# Patient Record
Sex: Female | Born: 1990 | Race: White | Hispanic: No | Marital: Single
Health system: Midwestern US, Community
[De-identification: ages and names within clinical notes are randomized; demographics above are authoritative.]

## PROBLEM LIST (undated history)

## (undated) DIAGNOSIS — Z3482 Encounter for supervision of other normal pregnancy, second trimester: Secondary | ICD-10-CM

## (undated) DIAGNOSIS — O99322 Drug use complicating pregnancy, second trimester: Secondary | ICD-10-CM

## (undated) DIAGNOSIS — Z3201 Encounter for pregnancy test, result positive: Secondary | ICD-10-CM

## (undated) DIAGNOSIS — O99320 Drug use complicating pregnancy, unspecified trimester: Secondary | ICD-10-CM

## (undated) DIAGNOSIS — F1120 Opioid dependence, uncomplicated: Secondary | ICD-10-CM

## (undated) DIAGNOSIS — E282 Polycystic ovarian syndrome: Secondary | ICD-10-CM

## (undated) DIAGNOSIS — M1990 Unspecified osteoarthritis, unspecified site: Secondary | ICD-10-CM

## (undated) HISTORY — DX: Polycystic ovarian syndrome: E28.2

## (undated) HISTORY — DX: Unspecified osteoarthritis, unspecified site: M19.90

---

## 2014-08-18 ENCOUNTER — Ambulatory Visit (INDEPENDENT_AMBULATORY_CARE_PROVIDER_SITE_OTHER): Payer: Managed Care, Other (non HMO) | Admitting: Emergency Medicine

## 2014-08-18 VITALS — BP 122/64 | HR 98 | Temp 99.1°F | Resp 16 | Ht 65.5 in | Wt 174.8 lb

## 2014-08-18 DIAGNOSIS — R1114 Bilious vomiting: Secondary | ICD-10-CM | POA: Diagnosis not present

## 2014-08-18 LAB — POCT URINALYSIS DIPSTICK
Glucose, UA: NEGATIVE
Ketones, UA: 15
Leukocytes, UA: NEGATIVE
NITRITE UA: NEGATIVE
Protein, UA: NEGATIVE
Spec Grav, UA: 1.025
UROBILINOGEN UA: 1
pH, UA: 6

## 2014-08-18 LAB — POCT CBC
Granulocyte percent: 75.2 %G (ref 37–80)
HCT, POC: 43.4 % (ref 37.7–47.9)
Hemoglobin: 14.3 g/dL (ref 12.2–16.2)
Lymph, poc: 2.9 (ref 0.6–3.4)
MCH: 27.8 pg (ref 27–31.2)
MCHC: 32.9 g/dL (ref 31.8–35.4)
MCV: 84.4 fL (ref 80–97)
MID (cbc): 1.1 — AB (ref 0–0.9)
MPV: 7.4 fL (ref 0–99.8)
POC GRANULOCYTE: 12.3 — AB (ref 2–6.9)
POC LYMPH PERCENT: 17.9 %L (ref 10–50)
POC MID %: 6.9 %M (ref 0–12)
Platelet Count, POC: 416 10*3/uL (ref 142–424)
RBC: 5.14 M/uL (ref 4.04–5.48)
RDW, POC: 14.5 %
WBC: 16.4 10*3/uL — AB (ref 4.6–10.2)

## 2014-08-18 LAB — POCT UA - MICROSCOPIC ONLY
CRYSTALS, UR, HPF, POC: NEGATIVE
Casts, Ur, LPF, POC: NEGATIVE
YEAST UA: NEGATIVE

## 2014-08-18 LAB — GLUCOSE, POCT (MANUAL RESULT ENTRY): POC Glucose: 81 mg/dl (ref 70–99)

## 2014-08-18 LAB — POCT URINE PREGNANCY: Preg Test, Ur: NEGATIVE

## 2014-08-18 LAB — POCT GLYCOSYLATED HEMOGLOBIN (HGB A1C): HEMOGLOBIN A1C: 5.3

## 2014-08-18 MED ORDER — ONDANSETRON 8 MG PO TBDP: 8.0000 mg | ORAL_TABLET | Freq: Three times a day (TID) | ORAL | Status: AC | PRN

## 2014-08-18 NOTE — Progress Notes (Signed)
Subjective:  Patient ID: Peggy Maynard, female    DOB: 06-16-1990  Age: 24 y.o. MRN: [id number]  CC: Emesis; Nausea; Bleeding/Bruising; Fever; and Chills   HPI Peggy Maynard presents  patient with sudden onset of nausea and vomiting over the last 3 days. She's had a sensation of fever with chills. Although no documented temperature elevation. She's had no stool change. She's had no travel or questionable water. She has no sick contacts. She has no abdominal pain. Last menstrual period was a month ago and that was induced with Provera She is also concerned because she has some bruising on her right wrist but has 2 large dogs that jump upon her. She has no blood in her stool or black stool no blood in her urine.  Peggy Maynard has a past medical history of Arthritis and PCOS (polycystic ovarian syndrome).   She has no past surgical history on file.   Her  family history includes Cancer in her maternal grandmother; Diabetes in her maternal grandmother; Stroke in her maternal grandmother.  She   reports that she has been smoking.  She does not have any smokeless tobacco history on file. Her alcohol and drug histories are not on file.  No outpatient prescriptions prior to visit.   No facility-administered medications prior to visit.    History   Social History  . Marital Status: Married    Spouse Name: N/A  . Number of Children: N/A  . Years of Education: N/A   Social History Main Topics  . Smoking status: Current Every Day Smoker  . Smokeless tobacco: Not on file  . Alcohol Use: Not on file  . Drug Use: Not on file  . Sexual Activity: Not on file   Other Topics Concern  . None   Social History Narrative  . None     Review of Systems  Constitutional: Negative for fever, chills and appetite change.  HENT: Negative for congestion, ear pain, postnasal drip, sinus pressure and sore throat.   Eyes: Negative for pain and redness.  Respiratory: Negative for cough, shortness  of breath and wheezing.   Cardiovascular: Negative for leg swelling.  Gastrointestinal: Negative for nausea, vomiting, abdominal pain, diarrhea, constipation and blood in stool.  Endocrine: Negative for polyuria.  Genitourinary: Negative for dysuria, urgency, frequency and flank pain.  Musculoskeletal: Negative for gait problem.  Skin: Negative for rash.  Neurological: Negative for weakness and headaches.  Psychiatric/Behavioral: Negative for confusion and decreased concentration. The patient is not nervous/anxious.     Objective:  BP 122/64 mmHg  Pulse 98  Temp(Src) 99.1 F (37.3 C) (Oral)  Resp 16  Ht 5' 5.5" (1.664 m)  Wt 174 lb 12.8 oz (79.289 kg)  BMI 28.64 kg/m2  SpO2 98%  LMP 07/20/2014  Physical Exam  Constitutional: She is oriented to person, place, and time. She appears well-developed and well-nourished. No distress.  HENT:  Head: Normocephalic and atraumatic.  Right Ear: External ear normal.  Left Ear: External ear normal.  Nose: Nose normal.  Eyes: Conjunctivae and EOM are normal. Pupils are equal, round, and reactive to light. No scleral icterus.  Neck: Normal range of motion. Neck supple. No tracheal deviation present.  Cardiovascular: Normal rate, regular rhythm and normal heart sounds.   Pulmonary/Chest: Effort normal. No respiratory distress. She has no wheezes. She has no rales.  Abdominal: She exhibits no mass. There is no tenderness. There is no rebound and no guarding.  Musculoskeletal: She exhibits no edema.  Lymphadenopathy:  She has no cervical adenopathy.  Neurological: She is alert and oriented to person, place, and time. Coordination normal.  Skin: Skin is warm and dry. No rash noted.  Psychiatric: She has a normal mood and affect. Her behavior is normal.      Assessment & Plan:   Peggy Maynard was seen today for emesis, nausea, bleeding/bruising, fever and chills.  Diagnoses and all orders for this visit:  Bilious vomiting with  nausea Orders: -     POCT CBC -     POCT glycosylated hemoglobin (Hb A1C) -     POCT glucose (manual entry) -     Comprehensive metabolic panel -     POCT UA - Microscopic Only -     POCT urinalysis dipstick -     POCT urine pregnancy   I am having Peggy Maynard maintain her BuPROPion HCl (WELLBUTRIN PO).  Meds ordered this encounter  Medications  . BuPROPion HCl (WELLBUTRIN PO)    Sig: Take by mouth.    Appropriate red flag conditions were discussed with the patient as well as actions that should be taken.  Patient expressed his understanding.  Follow-up: No Follow-up on file.  Carmelina Dane, MD  Results for orders placed or performed in visit on 08/18/14  POCT CBC  Result Value Ref Range   WBC 16.4 (A) 4.6 - 10.2 K/uL   Lymph, poc 2.9 0.6 - 3.4   POC LYMPH PERCENT 17.9 10 - 50 %L   MID (cbc) 1.1 (A) 0 - 0.9   POC MID % 6.9 0 - 12 %M   POC Granulocyte 12.3 (A) 2 - 6.9   Granulocyte percent 75.2 37 - 80 %G   RBC 5.14 4.04 - 5.48 M/uL   Hemoglobin 14.3 12.2 - 16.2 g/dL   HCT, POC 16.1 09.6 - 47.9 %   MCV 84.4 80 - 97 fL   MCH, POC 27.8 27 - 31.2 pg   MCHC 32.9 31.8 - 35.4 g/dL   RDW, POC 04.5 %   Platelet Count, POC 416 142 - 424 K/uL   MPV 7.4 0 - 99.8 fL  POCT glycosylated hemoglobin (Hb A1C)  Result Value Ref Range   Hemoglobin A1C 5.3   POCT glucose (manual entry)  Result Value Ref Range   POC Glucose 81 70 - 99 mg/dl  POCT UA - Microscopic Only  Result Value Ref Range   WBC, Ur, HPF, POC 1-3    RBC, urine, microscopic 3-5    Bacteria, U Microscopic trace    Mucus, UA 1+    Epithelial cells, urine per micros 2-4    Crystals, Ur, HPF, POC neg    Casts, Ur, LPF, POC neg    Yeast, UA neg   POCT urinalysis dipstick  Result Value Ref Range   Color, UA yellow    Clarity, UA clear    Glucose, UA neg    Bilirubin, UA mod    Ketones, UA 15    Spec Grav, UA 1.025    Blood, UA small    pH, UA 6.0    Protein, UA neg    Urobilinogen, UA 1.0    Nitrite,  UA neg    Leukocytes, UA Negative Negative  POCT urine pregnancy  Result Value Ref Range   Preg Test, Ur Negative Negative

## 2014-08-18 NOTE — Patient Instructions (Signed)
Clear Liquid Diet A clear liquid diet is a short-term diet that is prescribed to provide the necessary fluid and basic energy you need when you can have nothing else. The clear liquid diet consists of liquids or solids that will become liquid at room temperature. You should be able to see through the liquid. There are many reasons that you may be restricted to clear liquids, such as:  When you have a sudden-onset (acute) condition that occurs before or after surgery.  To help your body slowly get adjusted to food again after a long period when you were unable to have food.  Replacement of fluids when you have a diarrheal disease.  When you are going to have certain exams, such as a colonoscopy, in which instruments are inserted inside your body to look at parts of your digestive system. WHAT CAN I HAVE? A clear liquid diet does not provide all the nutrients you need. It is important to choose a variety of the following items to get as many nutrients as possible:  Vegetable juices that do not have pulp.  Fruit juices and fruit drinks that do not have pulp.  Coffee (regular or decaffeinated), tea, or soda at the discretion of your health care provider.  Clear bouillon, broth, or strained broth-based soups.  High-protein and flavored gelatins.  Sugar or honey.  Ices or frozen ice pops that do not contain milk. If you are not sure whether you can have certain items, you should ask your health care provider. You may also ask your health care provider if there are any other clear liquid options. Document Released: 01/01/2005 Document Revised: 01/06/2013 Document Reviewed: 11/28/2012 ExitCare Patient Information 2015 [employer]. This information is not intended to replace advice given to you by your health care provider. Make sure you discuss any questions you have with your health care provider.  

## 2014-08-19 LAB — HEPATITIS PANEL, ACUTE
HCV Ab: NEGATIVE
HEP B C IGM: NONREACTIVE
Hep A IgM: NONREACTIVE
Hepatitis B Surface Ag: NEGATIVE

## 2014-08-19 LAB — COMPREHENSIVE METABOLIC PANEL
ALK PHOS: 69 U/L (ref 33–115)
ALT: 65 U/L — AB (ref 6–29)
AST: 40 U/L — AB (ref 10–30)
Albumin: 5.1 g/dL (ref 3.6–5.1)
BUN: 17 mg/dL (ref 7–25)
CHLORIDE: 103 mmol/L (ref 98–110)
CO2: 23 mmol/L (ref 20–31)
CREATININE: 0.71 mg/dL (ref 0.50–1.10)
Calcium: 10.1 mg/dL (ref 8.6–10.2)
Glucose, Bld: 92 mg/dL (ref 65–99)
Potassium: 3.8 mmol/L (ref 3.5–5.3)
SODIUM: 138 mmol/L (ref 135–146)
TOTAL PROTEIN: 7.9 g/dL (ref 6.1–8.1)
Total Bilirubin: 0.6 mg/dL (ref 0.2–1.2)

## 2014-08-19 LAB — LIPASE: LIPASE: 7 U/L (ref 7–60)

## 2014-08-19 LAB — AMYLASE: Amylase: 58 U/L (ref 0–105)

## 2014-08-20 ENCOUNTER — Encounter: Payer: Self-pay | Admitting: Family Medicine

## 2014-12-02 ENCOUNTER — Encounter (HOSPITAL_COMMUNITY): Payer: Self-pay | Admitting: Cardiology

## 2014-12-02 ENCOUNTER — Emergency Department (HOSPITAL_COMMUNITY)
Admission: EM | Admit: 2014-12-02 | Discharge: 2014-12-02 | Disposition: A | Discharge status: Home / Self Care | Payer: Managed Care, Other (non HMO) | Attending: Emergency Medicine | Admitting: Emergency Medicine

## 2014-12-02 ENCOUNTER — Emergency Department (HOSPITAL_COMMUNITY): Payer: Managed Care, Other (non HMO)

## 2014-12-02 DIAGNOSIS — H66002 Acute suppurative otitis media without spontaneous rupture of ear drum, left ear: Secondary | ICD-10-CM | POA: Diagnosis not present

## 2014-12-02 DIAGNOSIS — J069 Acute upper respiratory infection, unspecified: Secondary | ICD-10-CM | POA: Insufficient documentation

## 2014-12-02 DIAGNOSIS — Z8639 Personal history of other endocrine, nutritional and metabolic disease: Secondary | ICD-10-CM | POA: Insufficient documentation

## 2014-12-02 DIAGNOSIS — Z87891 Personal history of nicotine dependence: Secondary | ICD-10-CM | POA: Insufficient documentation

## 2014-12-02 DIAGNOSIS — R05 Cough: Secondary | ICD-10-CM | POA: Diagnosis present

## 2014-12-02 DIAGNOSIS — Z8739 Personal history of other diseases of the musculoskeletal system and connective tissue: Secondary | ICD-10-CM | POA: Insufficient documentation

## 2014-12-02 DIAGNOSIS — M940 Chondrocostal junction syndrome [Tietze]: Secondary | ICD-10-CM | POA: Diagnosis not present

## 2014-12-02 DIAGNOSIS — R059 Cough, unspecified: Secondary | ICD-10-CM

## 2014-12-02 MED ORDER — AMOXICILLIN 500 MG PO TABS: 1000.0000 mg | ORAL_TABLET | Freq: Two times a day (BID) | ORAL | Status: AC

## 2014-12-02 NOTE — ED Notes (Signed)
Secondary assessment being completed and documented by PA 

## 2014-12-02 NOTE — ED Provider Notes (Signed)
CSN: [id number]   Arrival date & time 12/02/14 1254  History  By signing my name below, I, Bethel Born, attest that this documentation has been prepared under the direction and in the presence of [employer] PA-C Electronically Signed: Bethel Born, ED Scribe. 12/02/2014. 2:29 PM. Chief Complaint  Patient presents with  . Cough  . Nasal Congestion    HPI Patient is a 24 y.o. female presenting with cough. The history is provided by the patient. No language interpreter was used.  Cough Cough characteristics:  Dry Severity:  Moderate Onset quality:  Gradual Duration:  1 week Timing:  Constant Progression:  Improving Chronicity:  New Smoker: no   Context: upper respiratory infection   Context: not sick contacts   Relieved by:  Nothing Worsened by:  Nothing tried Ineffective treatments:  Cough suppressants and decongestant Associated symptoms: chills, ear pain (L ear), fever (Tmax 100.1), rhinorrhea, sinus congestion and sore throat   Associated symptoms: no chest pain, no diaphoresis, no ear fullness, no eye discharge, no myalgias, no rash, no shortness of breath and no wheezing   Risk factors: no recent travel    Paola Aleshire is a 24 y.o. female who presents to the Emergency Department complaining of a cough that was initially productive of yellow sputum but is now dry with onset 1 week ago. Associated symptoms include 2 days of fever up to 100.1, chills, sore throat, chest pain with coughing, muffled hearing in the left ear with pressure-like pain, yellow nasal drainage, and sinus congestion. OTC cough/cold medication and Tylenol have provided insufficient relief at home. Pt denies ear drainage, trouble swallowing, trismus, drooling, wheezing, SOB, n/v/d/c, abdominal pain, hematuria, dysuria, myalgias, arthralgias, and rash. No known sick contact. No history of asthma. Pt recently stopped using tobacco 2 mos ago. She has no PCP at this time.     Past Medical  History  Diagnosis Date  . Arthritis   . PCOS (polycystic ovarian syndrome)     History reviewed. No pertinent past surgical history.  Family History  Problem Relation Age of Onset  . Diabetes Maternal Grandmother   . Cancer Maternal Grandmother   . Stroke Maternal Grandmother     Social History  Substance Use Topics  . Smoking status: Former Games developer  . Smokeless tobacco: None  . Alcohol Use: No     Review of Systems  Constitutional: Positive for fever (Tmax 100.1) and chills. Negative for diaphoresis.  HENT: Positive for congestion, ear pain (L ear), hearing loss (L ear), rhinorrhea and sore throat. Negative for drooling, ear discharge and trouble swallowing.        Muffled hearing in the left ear  Eyes: Negative for pain and discharge.  Respiratory: Positive for cough. Negative for shortness of breath and wheezing.   Cardiovascular: Negative for chest pain.  Gastrointestinal: Negative for nausea, vomiting, abdominal pain, diarrhea and constipation.  Genitourinary: Negative for dysuria, hematuria and difficulty urinating.  Musculoskeletal: Negative for myalgias and arthralgias.  Skin: Negative for rash.  Allergic/Immunologic: Negative for immunocompromised state.  Neurological: Negative for weakness and numbness.  Psychiatric/Behavioral: Negative for confusion.   10 Systems reviewed and all are negative for acute change except as noted in the HPI.   Home Medications   Prior to Admission medications   Medication Sig Start Date End Date Taking? Authorizing Provider  amoxicillin (AMOXIL) 500 MG tablet Take 2 tablets (1,000 mg total) by mouth 2 (two) times daily. 12/02/14   Arbell Wycoff Camprubi-Soms, PA-C  BuPROPion HCl (WELLBUTRIN PO) Take  by mouth.    Historical Provider, MD  ondansetron (ZOFRAN-ODT) 8 MG disintegrating tablet Take 1 tablet (8 mg total) by mouth every 8 (eight) hours as needed for nausea. 08/18/14   Carmelina Dane, MD    Allergies  Review of patient's  allergies indicates no known allergies.  Triage Vitals: BP 160/93 mmHg  Pulse 99  Temp(Src) 98.4 F (36.9 C) (Oral)  Resp 18  Ht  (1.651 m)  Wt 174 lb (78.926 kg)  BMI 28.96 kg/m2  SpO2 97%  LMP 11/12/2014  Physical Exam  Constitutional: She is oriented to person, place, and time. Vital signs are normal. She appears well-developed and well-nourished.  Non-toxic appearance. No distress.  Afebrile, nontoxic, NAD  HENT:  Head: Normocephalic and atraumatic.  Right Ear: Hearing, tympanic membrane, external ear and ear canal normal.  Left Ear: Hearing, external ear and ear canal normal. No drainage, swelling or tenderness. Tympanic membrane is erythematous and bulging. A middle ear effusion is present.  Nose: Mucosal edema and rhinorrhea present.  Mouth/Throat: Uvula is midline, oropharynx is clear and moist and mucous membranes are normal. No trismus in the jaw. No uvula swelling.  Left ear with canal clear, no pain with pinna retraction, with bulging erythematous TM with effusion. Right ear clear. Nose with mucosal edema and rhinorrhea. Oropharynx clear and moist, without uvular swelling or deviation, no trismus or drooling, no tonsillar swelling or erythema, no exudates.    Eyes: Conjunctivae and EOM are normal. Right eye exhibits no discharge. Left eye exhibits no discharge.  Neck: Normal range of motion. Neck supple.  Cardiovascular: Normal rate, regular rhythm, normal heart sounds and intact distal pulses.  Exam reveals no gallop and no friction rub.   No murmur heard. Pulmonary/Chest: Effort normal and breath sounds normal. No respiratory distress. She has no decreased breath sounds. She has no wheezes. She has no rhonchi. She has no rales. She exhibits tenderness. She exhibits no crepitus, no deformity and no retraction.    CTAB in all lung fields, with intermittent dry hacking cough, no w/r/r, no hypoxia or increased WOB, speaking in full sentences, SpO2 97% on RA. Chest wall  with mild TTP anteriorly. No crepitus, retraction, deformity.    Abdominal: Soft. Normal appearance and bowel sounds are normal. She exhibits no distension. There is no tenderness. There is no rigidity, no rebound, no guarding, no CVA tenderness, no tenderness at McBurney's point and negative Murphy's sign.  Musculoskeletal: Normal range of motion.  Lymphadenopathy:    She has cervical adenopathy.  Shotty LAD bilaterally with mild TTP.  Neurological: She is alert and oriented to person, place, and time. She has normal strength. No sensory deficit.  Skin: Skin is warm, dry and intact. No rash noted.  Psychiatric: She has a normal mood and affect.  Nursing note and vitals reviewed.   ED Course  Procedures  DIAGNOSTIC STUDIES: Oxygen Saturation is 97% on RA,  normal by my interpretation.    COORDINATION OF CARE: 2:21 PM Discussed treatment plan which includes CXR and discharge with abx and symptomatic treatment with pt at bedside and pt agreed to the plan.  Labs Review- Labs Reviewed - No data to display  Imaging Review Dg Chest 2 View  12/02/2014  CLINICAL DATA:  Productive cough for 1 week.  Chest pain. EXAM: CHEST  2 VIEW COMPARISON:  None. FINDINGS: The heart size and mediastinal contours are within normal limits. Both lungs are clear. The visualized skeletal structures are unremarkable. IMPRESSION: No active  cardiopulmonary disease. Electronically Signed   By: Elige KoHetal  Patel   On: 12/02/2014 13:47    MDM   Final diagnoses:  Acute suppurative otitis media of left ear without spontaneous rupture of tympanic membrane, recurrence not specified  Cough  URI (upper respiratory infection)  Costochondritis    24 y.o. female here with URI symptoms and L otitis media. Most likely viral otitis but could be bacterial given fevers at home. Pt is afebrile here with a clear lung exam, CXR obtained in triage was negative. Mild rhinorrhea, mild chest wall tenderness. Likely viral URI but will  treat for bacterial otitis. Pt is agreeable to symptomatic treatment with close follow up with PCP (from ins company list) as needed but spoke at length about emergent changing or worsening of symptoms that should prompt return to ER. Pt voices understanding and is agreeable to plan. Stable at time of discharge.   I personally performed the services described in this documentation, which was scribed in my presence. The recorded information has been reviewed and is accurate.  BP 160/93 mmHg  Pulse 99  Temp(Src) 98.4 F (36.9 C) (Oral)  Resp 18  Ht 5\' 5"  (1.651 m)  Wt 174 lb (78.926 kg)  BMI 28.96 kg/m2  SpO2 97%  LMP 11/12/2014  Meds ordered this encounter  Medications  . amoxicillin (AMOXIL) 500 MG tablet    Sig: Take 2 tablets (1,000 mg total) by mouth 2 (two) times daily.    Dispense:  28 tablet    Refill:  0    Order Specific Question:  Supervising Provider    Answer:  Eber HongMILLER, BRIAN [[id number]]        Dylann Gallier Camprubi-Soms, PA-C 12/02/14 1436  Linwood DibblesJon Knapp, MD 12/03/14 [phone]

## 2014-12-02 NOTE — Discharge Instructions (Signed)
Continue to stay well-hydrated. Gargle warm salt water and spit it out. Continue to alternate between Tylenol and Ibuprofen for pain or fever. Use amoxicillin as directed for your ear infection. Use Mucinex for cough suppression/expectoration of mucus. Use netipot and flonase to help with nasal congestion. May consider over-the-counter Benadryl or other antihistamine to decrease secretions and for watery itchy eyes. Followup with a primary care doctor (call your insurance company) in 5-7 days for recheck of ongoing symptoms. Return to emergency department for emergent changing or worsening of symptoms.    Cough, Adult A cough helps to clear your throat and lungs. A cough may last only 2-3 weeks (acute), or it may last longer than 8 weeks (chronic). Many different things can cause a cough. A cough may be a sign of an illness or another medical condition. HOME CARE  Pay attention to any changes in your cough.  Take medicines only as told by your doctor.  If you were prescribed an antibiotic medicine, take it as told by your doctor. Do not stop taking it even if you start to feel better.  Talk with your doctor before you try using a cough medicine.  Drink enough fluid to keep your pee (urine) clear or pale yellow.  If the air is dry, use a cold steam vaporizer or humidifier in your home.  Stay away from things that make you cough at work or at home.  If your cough is worse at night, try using extra pillows to raise your head up higher while you sleep.  Do not smoke, and try not to be around smoke. If you need help quitting, ask your doctor.  Do not have caffeine.  Do not drink alcohol.  Rest as needed. GET HELP IF:  You have new problems (symptoms).  You cough up yellow fluid (pus).  Your cough does not get better after 2-3 weeks, or your cough gets worse.  Medicine does not help your cough and you are not sleeping well.  You have pain that gets worse or pain that is not helped  with medicine.  You have a fever.  You are losing weight and you do not know why.  You have night sweats. GET HELP RIGHT AWAY IF:  You cough up blood.  You have trouble breathing.  Your heartbeat is very fast.   This information is not intended to replace advice given to you by your health care provider. Make sure you discuss any questions you have with your health care provider.   Document Released: 09/14/2010 Document Revised: 09/22/2014 Document Reviewed: 03/10/2014 Elsevier Interactive Patient Education 2016 Else[employer]  Costochondritis Costochondritis is a condition in which the tissue (cartilage) that connects your ribs with your breastbone (sternum) becomes irritated. It causes pain in the chest and rib area. It usually goes away on its own over time. HOME CARE  Avoid activities that wear you out.  Do not strain your ribs. Avoid activities that use your:  Chest.  Belly.  Side muscles.  Put ice on the area for the first 2 days after the pain starts.  Put ice in a plastic bag.  Place a towel between your skin and the bag.  Leave the ice on for 20 minutes, 2-3 times a day.  Only take medicine as told by your doctor. GET HELP IF:  You have redness or puffiness (swelling) in the rib area.  Your pain does not go away with rest or medicine. GET HELP RIGHT AWAY IF:  Your pain gets worse.  You are very uncomfortable.  You have trouble breathing.  You cough up blood.  You start sweating or throwing up (vomiting).  You have a fever or lasting symptoms for more than 2-3 days.  You have a fever and your symptoms suddenly get worse. MAKE SURE YOU:   Understand these instructions.  Will watch your condition.  Will get help right away if you are not doing well or get worse.   This information is not intended to replace advice given to you by your health care provider. Make sure you discuss any questions you have with your health care provider.     Document Released: 06/20/2007 Document Revised: 09/03/2012 Document Reviewed: 08/05/2012 Elsevier Interactive Patient Education 2016 Else[employer] Inc[employer]  Otitis Media, Adult Otitis media is redness, soreness, and puffiness (swelling) in the space just behind your eardrum (middle ear). It may be caused by allergies or infection. It often happens along with a cold. HOME CARE  Take your medicine as told. Finish it even if you start to feel better.  Only take over-the-counter or prescription medicines for pain, discomfort, or fever as told by your doctor.  Follow up with your doctor as told. GET HELP IF:  You have otitis media only in one ear, or bleeding from your nose, or both.  You notice a lump on your neck.  You are not getting better in 3-5 days.  You feel worse instead of better. GET HELP RIGHT AWAY IF:   You have pain that is not helped with medicine.  You have puffiness, redness, or pain around your ear.  You get a stiff neck.  You cannot move part of your face (paralysis).  You notice that the bone behind your ear hurts when you touch it. MAKE SURE YOU:   Understand these instructions.  Will watch your condition.  Will get help right away if you are not doing well or get worse.   This information is not intended to replace advice given to you by your health care provider. Make sure you discuss any questions you have with your health care provider.   Document Released: 06/20/2007 Document Revised: 01/22/2014 Document Reviewed: 07/29/2012 Elsevier Interactive Patient Education 2016 Elsevier Inc.  Viral Infections A virus is a type of germ. Viruses can cause:  Minor sore throats.  Aches and pains.  Headaches.  Runny nose.  Rashes.  Watery eyes.  Tiredness.  Coughs.  Loss of appetite.  Feeling sick to your stomach (nausea).  Throwing up (vomiting).  Watery poop (diarrhea). HOME CARE   Only take medicines as told by your doctor.  Drink enough  water and fluids to keep your pee (urine) clear or pale yellow. Sports drinks are a good choice.  Get plenty of rest and eat healthy. Soups and broths with crackers or rice are fine. GET HELP RIGHT AWAY IF:   You have a very bad headache.  You have shortness of breath.  You have chest pain or neck pain.  You have an unusual rash.  You cannot stop throwing up.  You have watery poop that does not stop.  You cannot keep fluids down.  You or your child has a temperature by mouth above 102 F (38.9 C), not controlled by medicine.  Your baby is older than 3 months with a rectal temperature of 102 F (38.9 C) or higher.  Your baby is 39 months old or younger with a rectal temperature of 100.4 F (38 C) or higher.  MAKE SURE YOU:   Understand these instructions.  Will watch this condition.  Will get help right away if you are not doing well or get worse.   This information is not intended to replace advice given to you by your health care provider. Make sure you discuss any questions you have with your health care provider.   Document Released: 12/15/2007 Document Revised: 03/26/2011 Document Reviewed: 06/09/2014 Elsevier Interactive Patient Education [employer]

## 2014-12-02 NOTE — ED Notes (Signed)
Reports a cough, congestion, and her eyes being puffy for the past week. Reports trying OTC medication without relief.

## 2015-01-17 ENCOUNTER — Encounter (HOSPITAL_COMMUNITY): Payer: Self-pay | Admitting: *Deleted

## 2015-01-17 ENCOUNTER — Emergency Department (HOSPITAL_COMMUNITY)
Admission: EM | Admit: 2015-01-17 | Discharge: 2015-01-17 | Disposition: A | Discharge status: Home / Self Care | Payer: Managed Care, Other (non HMO) | Attending: Emergency Medicine | Admitting: Emergency Medicine

## 2015-01-17 DIAGNOSIS — Z8639 Personal history of other endocrine, nutritional and metabolic disease: Secondary | ICD-10-CM | POA: Insufficient documentation

## 2015-01-17 DIAGNOSIS — Z87891 Personal history of nicotine dependence: Secondary | ICD-10-CM | POA: Insufficient documentation

## 2015-01-17 DIAGNOSIS — M1990 Unspecified osteoarthritis, unspecified site: Secondary | ICD-10-CM | POA: Insufficient documentation

## 2015-01-17 DIAGNOSIS — J020 Streptococcal pharyngitis: Secondary | ICD-10-CM

## 2015-01-17 DIAGNOSIS — Z79899 Other long term (current) drug therapy: Secondary | ICD-10-CM | POA: Insufficient documentation

## 2015-01-17 DIAGNOSIS — R000 Tachycardia, unspecified: Secondary | ICD-10-CM | POA: Insufficient documentation

## 2015-01-17 LAB — RAPID STREP SCREEN (MED CTR MEBANE ONLY): Streptococcus, Group A Screen (Direct): POSITIVE — AB

## 2015-01-17 MED ORDER — PENICILLIN G BENZATHINE 1200000 UNIT/2ML IM SUSP
1.2000 10*6.[IU] | Freq: Once | INTRAMUSCULAR | Status: AC
  Administered 2015-01-17: 1.2 10*6.[IU] via INTRAMUSCULAR
  Filled 2015-01-17: qty 2

## 2015-01-17 MED ORDER — ACETAMINOPHEN 325 MG PO TABS
ORAL_TABLET | ORAL | Status: AC
  Filled 2015-01-17: qty 2

## 2015-01-17 MED ORDER — SODIUM CHLORIDE 0.9 % IV BOLUS (SEPSIS)
1000.0000 mL | Freq: Once | INTRAVENOUS | Status: AC
  Administered 2015-01-17: 1000 mL via INTRAVENOUS

## 2015-01-17 MED ORDER — DEXAMETHASONE SODIUM PHOSPHATE 10 MG/ML IJ SOLN
10.0000 mg | Freq: Once | INTRAMUSCULAR | Status: AC
  Administered 2015-01-17: 10 mg via INTRAVENOUS
  Filled 2015-01-17: qty 1

## 2015-01-17 MED ORDER — KETOROLAC TROMETHAMINE 15 MG/ML IJ SOLN
15.0000 mg | Freq: Once | INTRAMUSCULAR | Status: AC
  Administered 2015-01-17: 15 mg via INTRAVENOUS
  Filled 2015-01-17: qty 1

## 2015-01-17 MED ORDER — HYDROCODONE-ACETAMINOPHEN 7.5-325 MG/15ML PO SOLN: 10.0000 mL | ORAL | Status: AC | PRN

## 2015-01-17 MED ORDER — ACETAMINOPHEN 325 MG PO TABS
650.0000 mg | ORAL_TABLET | Freq: Once | ORAL | Status: AC | PRN
Start: 2015-01-17 — End: 2015-01-17
  Administered 2015-01-17: 650 mg via ORAL

## 2015-01-17 NOTE — Discharge Instructions (Signed)

## 2015-01-17 NOTE — ED Notes (Signed)
Pt ambulated to restroom. 

## 2015-01-17 NOTE — ED Notes (Signed)
Pt reports sore throat, fever, dizziness, nausea and bodyaches. Mask on pt at triage.

## 2015-01-17 NOTE — ED Provider Notes (Signed)
CSN: [id number]     Arrival date & time 01/17/15  [id number] History   First MD Initiated Contact with Patient 01/17/15 [phone]     Chief Complaint  Patient presents with  . Sore Throat  . Fever     (Consider location/radiation/quality/duration/timing/severity/associated sxs/prior Treatment) HPI   [age]y female with sore throat, fever, nausea and body aches. Symptom onset about a day and half ago. Persistent since then. No cough. No acute respiratory complaints. No rash. No abdominal pain. Hx of PCOS, otherwise healthy.   Past Medical History  Diagnosis Date  . Arthritis   . PCOS (polycystic ovarian syndrome)    History reviewed. No pertinent past surgical history. Family History  Problem Relation Age of Onset  . Diabetes Maternal Grandmother   . Cancer Maternal Grandmother   . Stroke Maternal Grandmother    Social History  Substance Use Topics  . Smoking status: Former Games developermoker  . Smokeless tobacco: None  . Alcohol Use: No   OB History    No data available     Review of Systems  All systems reviewed and negative, other than as noted in HPI.   Allergies  Review of patient's allergies indicates no known allergies.  Home Medications   Prior to Admission medications   Medication Sig Start Date End Date Taking? Authorizing Provider  diphenhydramine-acetaminophen (TYLENOL PM) 25-500 MG TABS tablet Take 2 tablets by mouth at bedtime as needed (for sleep).   Yes Historical Provider, MD  DM-Phenylephrine-Acetaminophen (TYLENOL COLD MAX PO) Take 30 mLs by mouth every 6 (six) hours as needed (for congestion).   Yes Historical Provider, MD  sertraline (ZOLOFT) 50 MG tablet Take 50 mg by mouth daily. 01/04/15  Yes Historical Provider, MD  amoxicillin (AMOXIL) 500 MG tablet Take 2 tablets (1,000 mg total) by mouth 2 (two) times daily. Patient not taking: Reported on 01/17/2015 12/02/14   Mercedes Camprubi-Soms, PA-C  HYDROcodone-acetaminophen (HYCET) 7.5-325 mg/15 ml solution Take 10 mLs by  mouth every 4 (four) hours as needed for moderate pain. 01/17/15   Raeford RazorStephen Bretton Tandy, MD  ondansetron (ZOFRAN-ODT) 8 MG disintegrating tablet Take 1 tablet (8 mg total) by mouth every 8 (eight) hours as needed for nausea. Patient not taking: Reported on 01/17/2015 08/18/14   Carmelina DaneJeffery S Anderson, MD   BP 139/91 mmHg  Pulse 122  Temp(Src) 101.2 F (38.4 C) (Oral)  Resp 18  Ht 5\' 5"  (1.651 m)  Wt 182 lb (82.555 kg)  BMI 30.29 kg/m2  SpO2 99%  LMP 01/12/2015 Physical Exam  Constitutional: She appears well-developed and well-nourished. No distress.  HENT:  Head: Normocephalic and atraumatic.  Exudative pharyngitis. Uvula midline. Normal sounding voice. Handling secretions. Neck is supple. Tender left cervical adenopathy.  Eyes: Conjunctivae are normal. Right eye exhibits no discharge. Left eye exhibits no discharge.  Neck: Neck supple.  Cardiovascular: Regular rhythm and normal heart sounds.  Exam reveals no gallop and no friction rub.   No murmur heard. tachcyardia  Pulmonary/Chest: Effort normal and breath sounds normal. No respiratory distress.  Abdominal: Soft. She exhibits no distension. There is no tenderness.  Musculoskeletal: She exhibits no edema or tenderness.  Neurological: She is alert.  Skin: Skin is warm and dry.  Psychiatric: She has a normal mood and affect. Her behavior is normal. Thought content normal.  Nursing note and vitals reviewed.   ED Course  Procedures (including critical care time) Labs Review Labs Reviewed  RAPID STREP SCREEN (NOT AT [facility]) - Abnormal; Notable for the following:  Streptococcus, Group A Screen (Direct) POSITIVE (*)    All other components within normal limits    Imaging Review No results found. I have personally reviewed and evaluated these images and lab results as part of my medical decision-making.   EKG Interpretation None      MDM   Final diagnoses:  Strep pharyngitis    84 female with fever, sore throat and body aches.  Clinically strep pharyngitis. Rapid strep was subsequently positive. She has no acute respiratory complaints. No clinical evidence of possible impending airway compromise. Treated symptomatically with antipyretics, NSAIDs and IV fluids. She was given an IM dose of Bicillin. I feel she is appropriate for continued outpatient symptomatic treatment. Return precautions were discussed.  Raeford Razor, MD 01/27/15 1028

## 2015-10-03 NOTE — Nursing Note (Signed)
Nursing Discharge Summary - Text       Nursing Discharge Summary Entered On:  10/03/2015 20:33 EDT    Performed On:  10/03/2015 20:33 EDT by Bradly BienenstockMARTINEZ, RN, ANGELICA C               DC Information   Discharge To, Anticipated :   Home with family support   Mode of Discharge :   Ambulatory   Transportation :   Private vehicle   Accompanied By :   Adele SchilderFriend   MARTINEZ, RN, ANGELICA C - 10/03/2015 20:33 EDT   Education   Responsible Learner(s) :   No Data Available     Home Caregiver Present for Session :   No   Barriers To Learning :   None evident   Teaching Method :   Explanation, Printed materials   Bradly BienenstockMARTINEZ, RN, Hortencia ConradiNGELICA C - 10/03/2015 20:33 EDT   Post-Hospital Education Adult Grid   Pain Management :   Verbalizes understanding   Plan of Care :   Verbalizes understanding   When to Call Health Care Provider :   [facility]   Bradly BienenstockMARTINEZ, RN, Hortencia ConradiNGELICA C - 10/03/2015 20:33 EDT   Medication Education Adult Grid   Med Dosage, Route, Scheduling :   TEFL teacherVerbalizes understanding   Med Generic/Brand Name, Purpose, Action :   Verbalizes understanding   Med Preadministration Procedures :   [employer]bVerbalizes understanding   Med Teacher, musicpecial Administration, Storage :   [employer]Verbalizes understanding   MARTINEZ, RN, ANGELICA C - 10/03/2015 20:33 EDT

## 2016-10-24 IMAGING — DX DG CHEST 2V
2 series · 2 of 2 positions shown · non-contrast
Comparison: None.

CLINICAL DATA: Productive cough for 1 week.  Chest pain.

EXAM:
CHEST  2 VIEW

[chest pa]
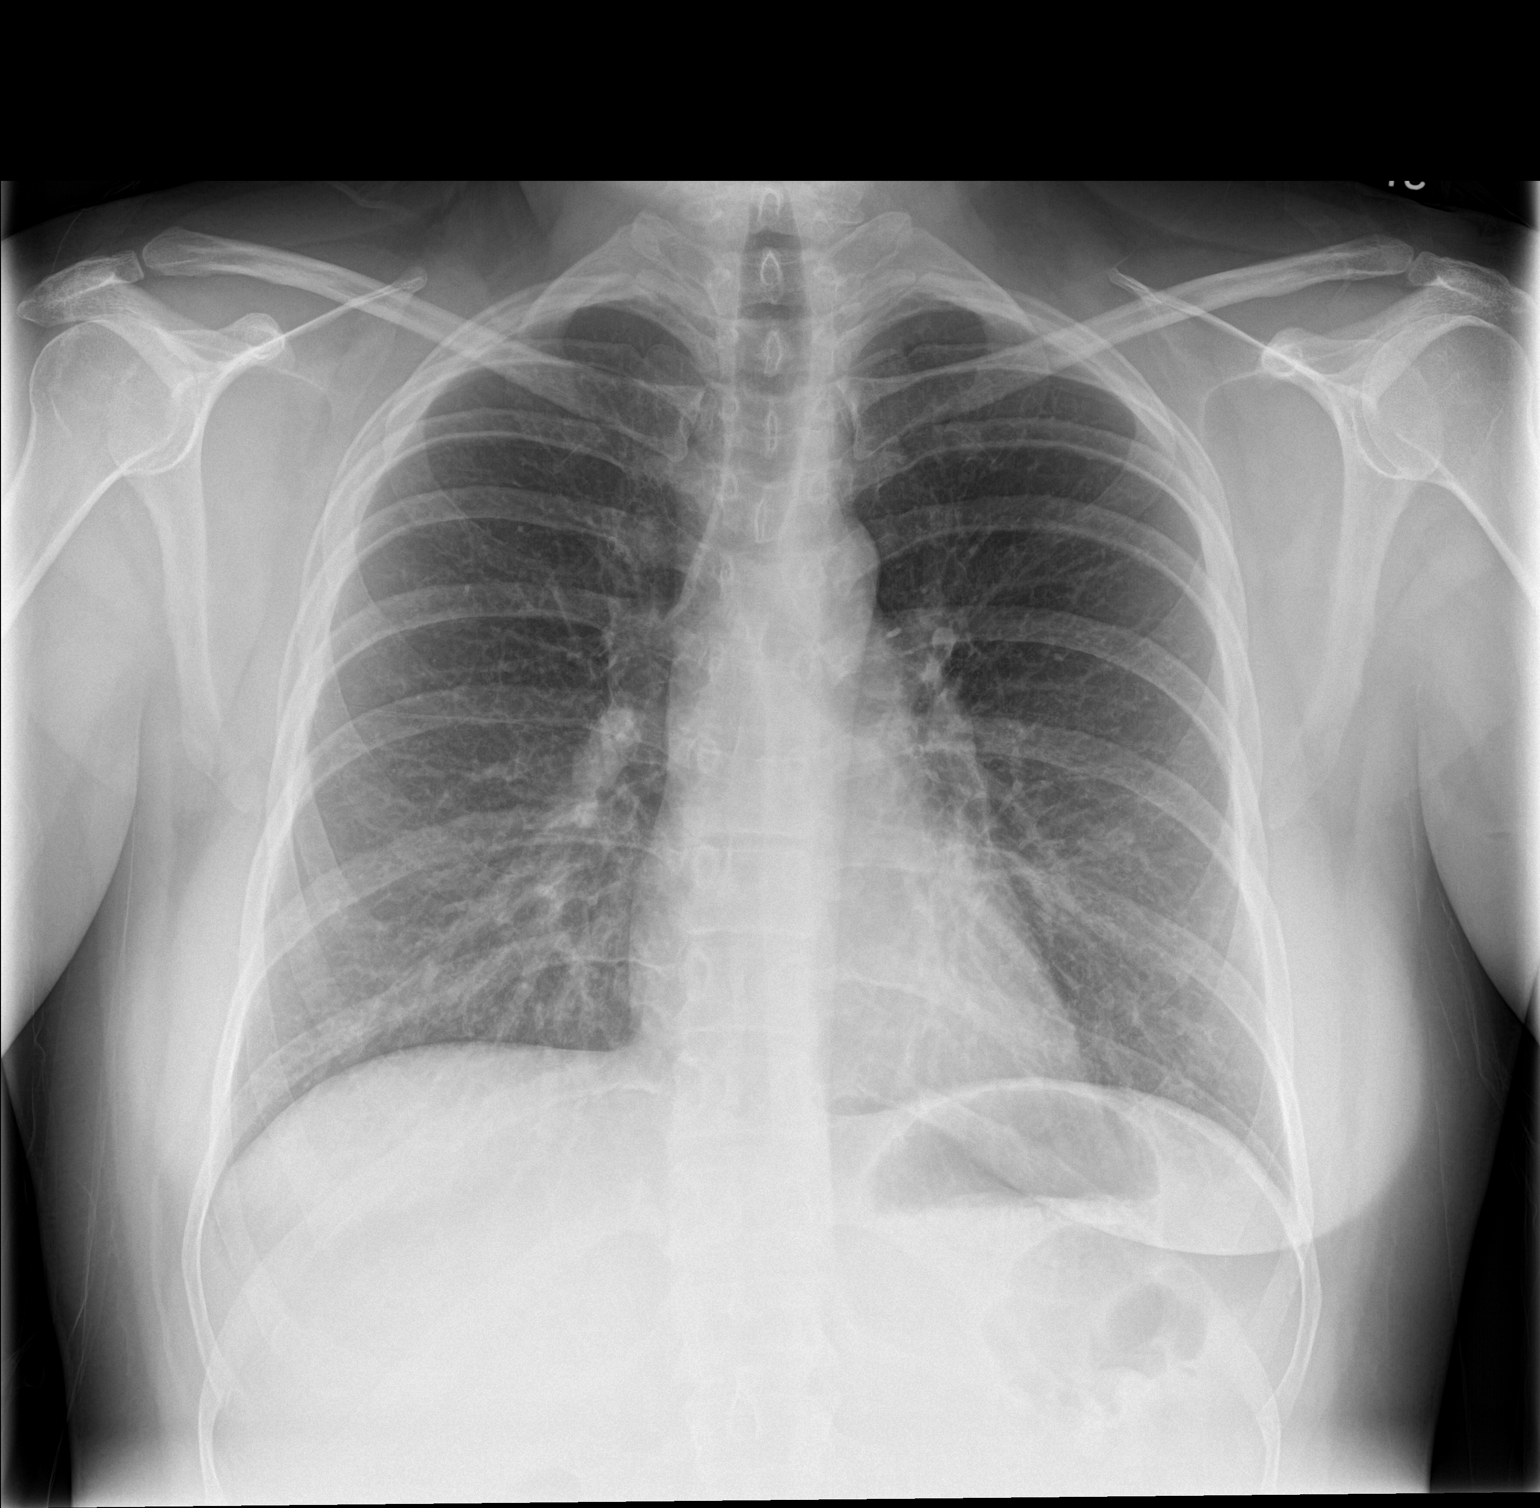

[chest lat]
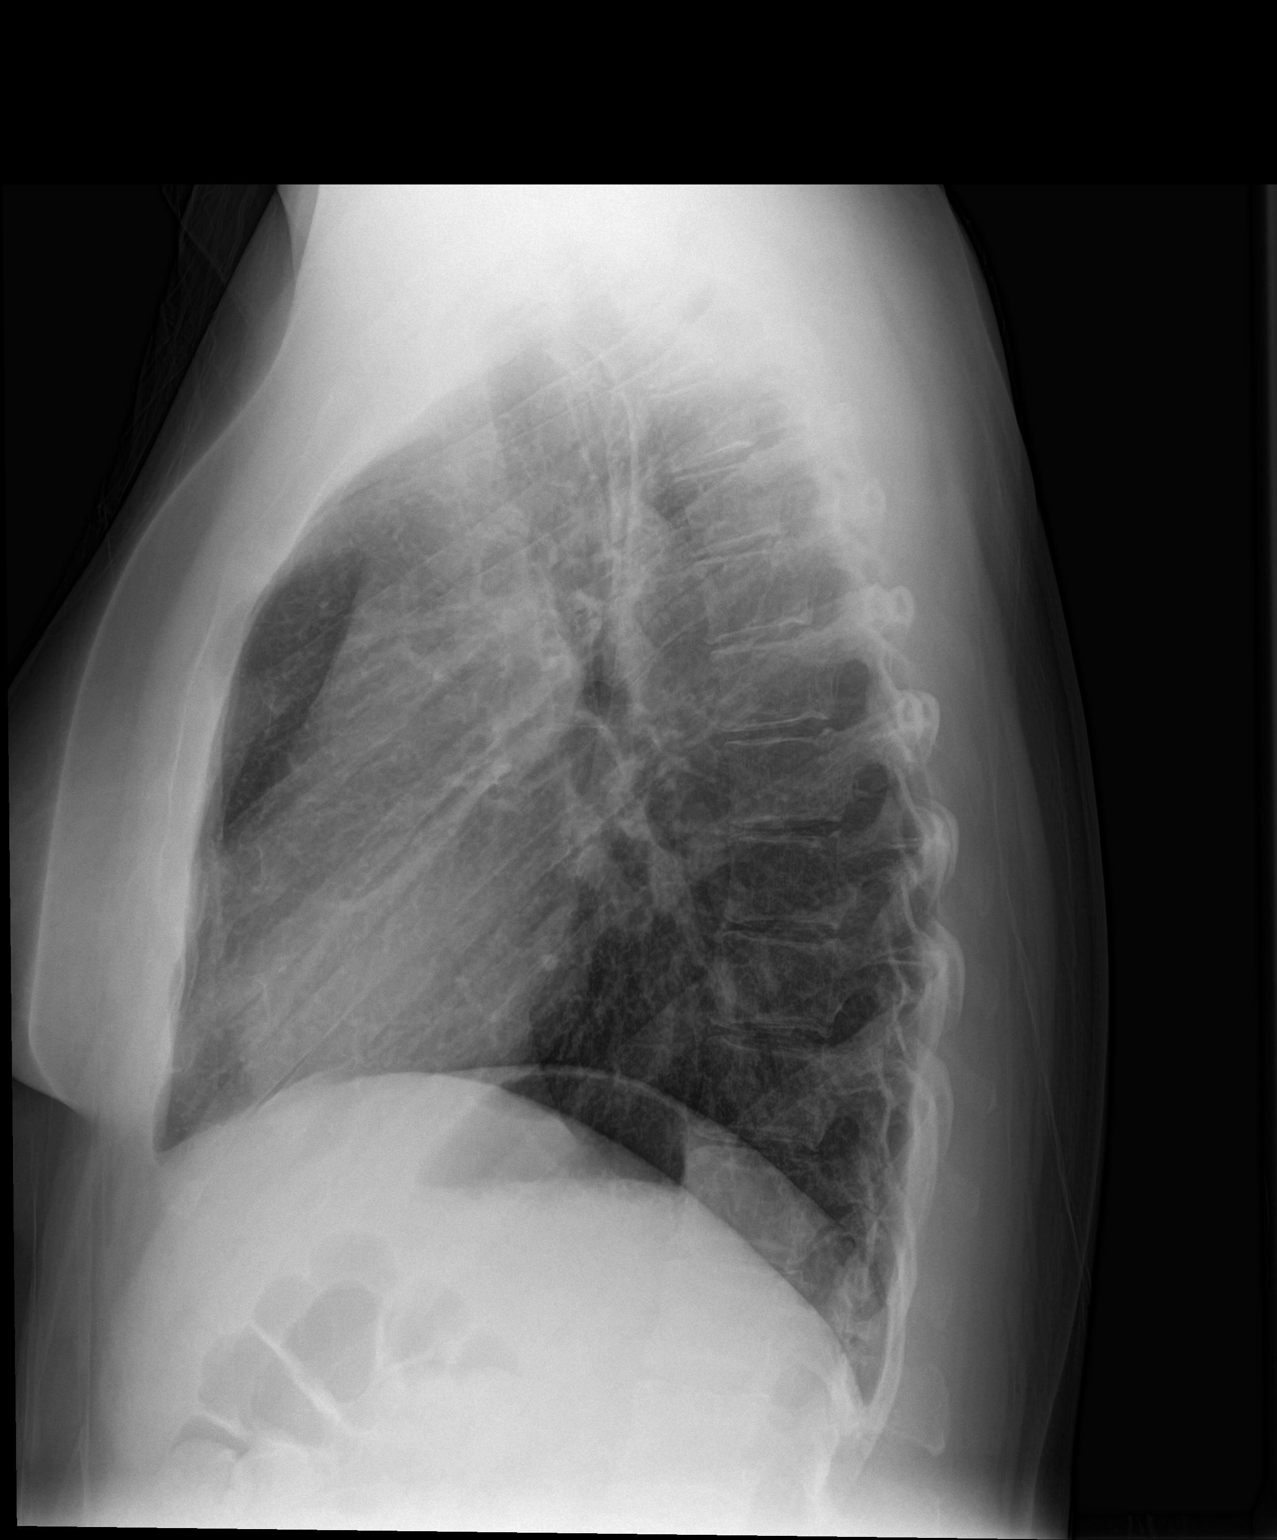

[2 of 2 positions shown; findings below may reference images not displayed]

FINDINGS: The heart size and mediastinal contours are within normal limits.
Both lungs are clear. The visualized skeletal structures are
unremarkable.
IMPRESSION: No active cardiopulmonary disease.

## 2022-05-17 ENCOUNTER — Ambulatory Visit: Admit: 2022-05-17 | Discharge: 2022-05-17 | Payer: MEDICAID | Attending: Family

## 2022-05-17 ENCOUNTER — Ambulatory Visit: Admit: 2022-05-17 | Discharge: 2022-05-17 | Payer: MEDICAID

## 2022-05-17 DIAGNOSIS — Z3201 Encounter for pregnancy test, result positive: Secondary | ICD-10-CM

## 2022-05-17 MED ORDER — ONDANSETRON 4 MG PO TBDP
4 | ORAL_TABLET | Freq: Three times a day (TID) | ORAL | 0 refills | Status: DC | PRN
Start: 2022-05-17 — End: 2022-06-14

## 2022-05-17 NOTE — Progress Notes (Signed)
Chief Complaint:  This 32 y.o. female presents today for an initial obstetrical examination.       HPI:   Lauren Rivers is a pleasant 32 y.o. -year-old White (non-Hispanic) female G2P1 with a last menstrual period of unknown who presents with amenorrhea and a positive home pregnancy test.   Her last Pap smear was over a year ago, results were normal  Since her last menstrual period, she has experienced nausea. She has frequent episodes of vomiting. She denies vaginal bleeding. Her past medical history is: first born FT vaginal denies gdm htn.   Pregnancy is complicated by subutex use. Sts has been on suboxone for 5 years and switched to subutex when she found out she was pregnant. Denies other drugs. Has used marijuana prior to knowledge she was pregnant    FOB is boyfriend. She feels safe       LMP:  Patient's last menstrual period was 02/26/2022 (approximate).    EDC:  Estimated Date of Delivery: None noted.    OB History:    OB History   Gravida Para Term Preterm AB Living   2 1 1  0 0 1   SAB IAB Ectopic Molar Multiple Live Births   0 0 0 0 0 1      # Outcome Date GA Lbr Len/2nd Weight Sex Delivery Anes PTL Lv   2 Current            1 Term 08/08/19 [redacted]w[redacted]d   M Vag-Spont   LIV      Allergies:  No Known Allergies    Medication History:  Current Outpatient Medications   Medication Sig Dispense Refill    buprenorphine (SUBUTEX) 8 MG SUBL SL tablet Place 1 tablet under the tongue daily. Max Daily Amount: 8 mg      Prenatal MV-Min-Fe Fum-FA-DHA (PRENATAL 1 PO) Take by mouth daily      ondansetron (ZOFRAN-ODT) 4 MG disintegrating tablet Take 1 tablet by mouth 3 times daily as needed for Nausea or Vomiting 21 tablet 0     No current facility-administered medications for this visit.       Past Medical History:  History reviewed. No pertinent past medical history.    Past Surgical History:  History reviewed. No pertinent surgical history.    Social History:  Social History     Socioeconomic History    Marital status:  Single     Spouse name: Not on file    Number of children: Not on file    Years of education: Not on file    Highest education level: Not on file   Occupational History    Not on file   Tobacco Use    Smoking status: Never    Smokeless tobacco: Never   Vaping Use    Vaping Use: Former   Substance and Sexual Activity    Alcohol use: Never    Drug use: Not Currently    Sexual activity: Yes     Partners: Male   Other Topics Concern    Not on file   Social History Narrative    Not on file     Social Determinants of Health     Financial Resource Strain: Not on file   Food Insecurity: Not on file   Transportation Needs: Not on file   Physical Activity: Not on file   Stress: Not on file   Social Connections: Not on file   Intimate Partner Violence: Not on file   Housing Stability:  Not on file       Family History:  History reviewed. No pertinent family history.        Review of Systems:  Review of Systems   Constitutional:  Positive for appetite change and fatigue. Negative for chills and fever.   Respiratory: Negative.  Negative for shortness of breath.    Cardiovascular:  Negative for palpitations.   Gastrointestinal:  Positive for nausea.   Genitourinary:  Negative for dysuria, hematuria and vaginal bleeding.   Neurological:  Negative for headaches.   Psychiatric/Behavioral: Negative.  Negative for suicidal ideas. The patient is not nervous/anxious.              OBJECTIVE:    BP 110/80   Ht 1.651 m (5\' 5" )   Wt 68 kg (150 lb)   LMP 02/26/2022 (Approximate)     Physical Exam  Constitutional:       Appearance: Normal appearance.   Genitourinary:      Rectum and urethral meatus normal.      Right Labia: No rash or lesions.     Left Labia: No lesions or rash.     No vaginal discharge, erythema, tenderness or ulceration.      No cervical discharge or friability.      Uterus exam comments: 10 weeks. .      No urethral prolapse, tenderness or mass present.   HENT:      Head: Normocephalic.   Cardiovascular:      Rate and  Rhythm: Normal rate.   Pulmonary:      Effort: Pulmonary effort is normal. No tachypnea, accessory muscle usage or respiratory distress.   Abdominal:      General: There is no distension.      Palpations: Abdomen is soft.   Musculoskeletal:      Cervical back: Normal range of motion.   Neurological:      General: No focal deficit present.      Mental Status: She is alert and oriented to person, place, and time.   Skin:     General: Skin is warm and dry.      Capillary Refill: Capillary refill takes less than 2 seconds.   Psychiatric:         Attention and Perception: Attention and perception normal.         Mood and Affect: Mood and affect normal.   Vitals reviewed.              Assessment:   Diagnosis Orders   1. Pregnancy test positive  US OB TRANSVAGINAL (CLINIC PERFORMED)    Hemoglobin A1C    Rubella antibody, IgG    Varicella Zoster Antibody, IgG    CBC with Auto Differential    HIV Screen    Antibody Screen    ABO/Rh    TSH with Reflex    Hepatitis B Surface Antigen    Urine Drug Screen 9 Panel    Hepatitis C Ab, Rflx to Qt by PCR    T Pallidum Screen W/Reflex    Culture, Urine      2. Encounter for antenatal screening for nuchal translucency  US FETAL NUCHAL TRANSLUCENCY SINGLE/1ST GESTATION (CLINIC PERFORMED)      3. Cervical cancer screening  PAP IG, CT-NG-TV, Aptima HPV and rfx 16/18,45 (161096)      4. Screening for HPV (human papillomavirus)  PAP IG, CT-NG-TV, Aptima HPV and rfx 16/18,45 (045409)      5. Possible exposure to STD  PAP IG,  CT-NG-TV, Aptima HPV and rfx 16/18,45 (161096)      6. Pregnancy complicated by subutex maintenance, antepartum (HCC)        7. Nausea and vomiting in pregnancy  ondansetron (ZOFRAN-ODT) 4 MG disintegrating tablet                Plan:     SLIUP measuring 1.82cm and GA of [redacted]w[redacted]d. She has unknown LMP for final edd of 12/25/22. Ys/fp/ca noted. Right and left adnexa unremarkable     Sab precautions  Referral to MFM for subutex in pregnancy  Prenatal packet given  Complaints of  daily vomiting. Requesting zofran. Risks given. Meds sent  NT NV      Ultrasound results: EDD by [facility] 12/25/22          Counseling:  Centering, adverse effects of alcohol, breast vs bottle feeding, childbirth classes, environment or work hazards, lifestyle changes, negative effects of smoking, physical activity, prenatal nutrition, sexual activity, toxoplasmosis precautions which includes avoidance of cat feces and raw material, listeria precautions, traveling, screening for chromosomal and genetic problems, WIC, Nuchal translucency, MSAFP testing, peripheral maternal blood draw for fetal cells and chromosomes, pros & cons of testing reviewed. Patient accepted testing.        Return in about 4 weeks (around 06/14/2022) for NT and ROB.

## 2022-05-18 ENCOUNTER — Encounter

## 2022-05-18 LAB — CULTURE, URINE: FINAL REPORT: NO GROWTH

## 2022-05-24 LAB — PAP IG, CT-NG-TV, APTIMA HPV AND RFX 16/18,45 (199315)
.: 0
Chlamydia trachomatis, NAA: NEGATIVE
HPV Aptima: NEGATIVE
Neisseria Gonorrhoeae, NAA: NEGATIVE
Trichomonas Vaginalis by NAA: NEGATIVE

## 2022-06-14 ENCOUNTER — Ambulatory Visit: Admit: 2022-06-14 | Discharge: 2022-06-14 | Payer: MEDICAID

## 2022-06-14 ENCOUNTER — Encounter: Admit: 2022-06-14 | Discharge: 2022-06-14 | Payer: PRIVATE HEALTH INSURANCE | Attending: Family

## 2022-06-14 DIAGNOSIS — Z3682 Encounter for antenatal screening for nuchal translucency: Secondary | ICD-10-CM

## 2022-06-14 DIAGNOSIS — Z3481 Encounter for supervision of other normal pregnancy, first trimester: Secondary | ICD-10-CM

## 2022-06-14 MED ORDER — ONDANSETRON HCL 4 MG PO TABS
4 | ORAL_TABLET | Freq: Three times a day (TID) | ORAL | 1 refills | Status: DC | PRN
Start: 2022-06-14 — End: 2022-07-30

## 2022-06-14 NOTE — Progress Notes (Signed)
Patient c/o having alot of morning sickness. Zofran sent.   Thinks she may be feeling depressed. Does not want meds. Recommended vitamin D3 and [facility].   NT today and nl. Plans to get labs today. Unity ordered.   Feeling stable on subutex. Referral to MFM for subutex in pregnancy.

## 2022-06-15 ENCOUNTER — Encounter

## 2022-06-16 LAB — CBC WITH AUTO DIFFERENTIAL
Basophils %: 0.3 % (ref 0.0–2.0)
Basophils Absolute: 0 10*3/uL (ref 0.0–0.2)
Eosinophils %: 0.4 % (ref 0.0–7.0)
Eosinophils Absolute: 0 10*3/uL (ref 0.0–0.5)
Hematocrit: 36.3 % (ref 34.0–47.0)
Hemoglobin: 12.2 g/dL (ref 11.5–15.7)
Immature Grans (Abs): 0.04 10*3/uL (ref 0.00–0.06)
Immature Granulocytes %: 0.4 % (ref 0.0–0.6)
Lymphocytes Absolute: 2.5 10*3/uL (ref 1.0–3.2)
Lymphocytes: 27.5 % (ref 15.0–45.0)
MCH: 26.9 pg — ABNORMAL LOW (ref 27.0–34.5)
MCHC: 33.6 g/dL (ref 32.0–36.0)
MCV: 80 fL — ABNORMAL LOW (ref 81.0–99.0)
MPV: 10.4 fL (ref 7.2–13.2)
Monocytes %: 3.5 % — ABNORMAL LOW (ref 4.0–12.0)
Monocytes Absolute: 0.3 10*3/uL (ref 0.3–1.0)
NRBC Absolute: 0 10*3/uL (ref 0.000–0.012)
NRBC Automated: 0 % (ref 0.0–0.2)
Neutrophils %: 67.9 % (ref 42.0–74.0)
Neutrophils Absolute: 6.3 10*3/uL (ref 1.6–7.3)
Platelets: 262 10*3/uL (ref 140–440)
RBC: 4.54 x10e6/mcL (ref 3.60–5.20)
RDW: 14.8 % (ref 11.0–16.0)
WBC: 9.2 10*3/uL (ref 3.8–10.6)

## 2022-06-16 LAB — VARICELLA ZOSTER ANTIBODY, IGG: Varicella-Zoster Virus Ab, Igg: 914 Index (ref >165)

## 2022-06-16 LAB — RUBELLA ANTIBODY, IGG
Rubella IgG Scr Interp: REACTIVE
Rubella IgG Scr: 50.7 IU/mL

## 2022-06-16 LAB — TSH WITH REFLEX: TSH: 2.2 mcIU/mL (ref 0.358–3.740)

## 2022-06-16 LAB — HCV QN INTERP

## 2022-06-16 LAB — HEPATITIS C AB, RFLX TO QT BY PCR: Hepatitis C Ab: NONREACTIVE

## 2022-06-16 LAB — HEMOGLOBIN A1C
Estimated Avg Glucose: 108
Estimated Avg Glucose: 115
Hemoglobin A1C: 5.4 % (ref 4.0–6.0)

## 2022-06-16 LAB — HIV SCREEN: HIV Screen: NEGATIVE

## 2022-06-16 LAB — HEPATITIS B SURFACE ANTIGEN: Hepatitis B Surface Ag: NEGATIVE

## 2022-06-16 LAB — T PALLIDUM SCREEN W/REFLEX: T. Pallidum Ab: NONREACTIVE

## 2022-06-17 LAB — ABO/RH: ABO/Rh: A POS

## 2022-06-17 LAB — ANTIBODY SCREEN: Antibody Screen: NEGATIVE

## 2022-06-19 LAB — URINE DRUG SCREEN 9 PANEL
Amphetamine Screen, Ur: NEGATIVE ng/mL
Barbiturate Screen, Ur: NEGATIVE ng/mL
Benzodiazepine Screen, Urine: NEGATIVE ng/mL
Cocaine Screen Urine: NEGATIVE ng/mL
Methadone Screen, Urine: NEGATIVE ng/mL
Opiate Screen, Urine: NEGATIVE ng/mL
Phencyclidine (PCP), Screen, Urine: NEGATIVE ng/mL
Propoxyphene Screen, Urine: NEGATIVE ng/mL

## 2022-06-19 LAB — CANNABINOID, URINE, CONFIRMATION
Cannabinoid Scrn, Ur: POSITIVE — AB
Carboxy THC GC/MS Urine: 36 ng/mL

## 2022-06-21 LAB — UNITY ANEUPLOIDY NIPT
Fetal Fraction: 11
Sex chromosome aneuploidy: NOT DETECTED

## 2022-06-25 ENCOUNTER — Inpatient Hospital Stay: Admit: 2022-06-25 | Payer: PRIVATE HEALTH INSURANCE

## 2022-06-25 DIAGNOSIS — F1120 Opioid dependence, uncomplicated: Secondary | ICD-10-CM

## 2022-06-25 DIAGNOSIS — O99322 Drug use complicating pregnancy, second trimester: Secondary | ICD-10-CM

## 2022-06-25 NOTE — Progress Notes (Signed)
32 year old G2 P1 at 14-4/7 weeks referred for consultation secondary to Subutex use.  The patient has a history of opiate use disorder but has not used any pills for the last 5 years.  She states that she was using pills and not any IV drugs.  She has been on Subutex.    Her medical history was reviewed and is notable for opiate use disorder.  She also has a history of postpartum depression.  She did not require medication and had resolution of symptoms.  Her obstetric history was reviewed.  Her first pregnancy resulted in a term spontaneous vaginal delivery of a baby weighing between 7 and 8 pounds.  That pregnancy was complicated by polyhydramnios.  She did not have gestational diabetes.  She also has what sounds like a shoulder dystocia resulting in a broken clavicle.  The baby is otherwise doing well.    Her medical history is notable for hypertension and diabetes.  Social history is notable for a remote history of smoking, she quit for 3 years ago.  No alcohol use no current drug use.  She does have a remote history of marijuana use.  Current medications include Subutex.  She has no known drug allergies.    Impression: IUP at 14-4/7 weeks.  Opiate use disorder stable on Subutex.  History of postpartum depression, history of shoulder dystocia    Counseling/recommendations:    1.  Opiate use disorder    The patient is counseled and we discussed that the benefits of Subutex treatment during pregnancy outweigh the risks of neonatal abstinence.  I have recommended that she continue    Additionally, I have recommended that she engage with [facility] women's behavioral health as an adjunct to her current therapy as she is also at risk for recurrent postpartum depression.    We will do a level 2 ultrasound at 18 to 20 weeks    She should have serial growth scans in the third trimester.  Antenatal testing can be reserved for the usual obstetric indications    2.  History of shoulder dystocia    Obtain records from her prior  delivery at [facility] for review.  She remains a candidate for vaginal delivery         MDM  2 stable problems  Moderate risk

## 2022-07-11 NOTE — Telephone Encounter (Signed)
Called pt and got her schd this Friday with wannamaker

## 2022-07-11 NOTE — Telephone Encounter (Signed)
Pt scheduled for ROB with Dr. Haskel Schroeder on 07/13/22, however pt only wants to be see with a female provider. Per the pt, Ressie stated pt needs to be seen by a provider due to high risk. Pt would prefer to be seen at [facility]. Or [place]. Unable to see any openings this week or next. Please advice.

## 2022-07-13 ENCOUNTER — Encounter: Attending: Obstetrics & Gynecology

## 2022-07-13 ENCOUNTER — Encounter: Admit: 2022-07-13 | Discharge: 2022-07-13 | Payer: PRIVATE HEALTH INSURANCE | Attending: Obstetrics & Gynecology

## 2022-07-13 VITALS — BP 100/70 | Ht 65.0 in | Wt 149.0 lb

## 2022-07-13 DIAGNOSIS — Z3482 Encounter for supervision of other normal pregnancy, second trimester: Secondary | ICD-10-CM

## 2022-07-13 DIAGNOSIS — Z0189 Encounter for other specified special examinations: Principal | ICD-10-CM

## 2022-07-13 NOTE — Progress Notes (Signed)
Afp today, anatomy scan with MFM on 7/17    MFM Counseling/recommendations:  1.  Opiate use disorder  The patient is counseled and we discussed that the benefits of Subutex treatment during pregnancy outweigh the risks of neonatal abstinence.  I have recommended that she continue  Additionally, I have recommended that she engage with [facility] women's behavioral health as an adjunct to her current therapy as she is also at risk for recurrent postpartum depression.  We will do a level 2 ultrasound at 18 to 20 weeks  She should have serial growth scans in the third trimester.  Antenatal testing can be reserved for the usual obstetric indications  2.  History of shoulder dystocia  Obtain records from her prior delivery at [facility] for review.  She remains a candidate for vaginal delivery

## 2022-07-15 LAB — AFP, SERUM, OPEN SPINA BIFIDA
AFP Maternal: NEGATIVE
AFP Mom: 1.36
AFP Value: 48.7 ng/mL
Gestational Age on Collection Date AFP: 16.4 weeks
Maternal Age at EDD AFP: 34.7
OSBR Risk 1 in AFP: 4034
Weight: 149 lb

## 2022-07-26 ENCOUNTER — Encounter

## 2022-07-28 ENCOUNTER — Encounter

## 2022-07-30 MED ORDER — ONDANSETRON HCL 4 MG PO TABS
4 | ORAL_TABLET | ORAL | 0 refills | Status: DC
Start: 2022-07-30 — End: 2022-08-28

## 2022-07-31 ENCOUNTER — Encounter

## 2022-08-01 ENCOUNTER — Encounter

## 2022-08-01 ENCOUNTER — Inpatient Hospital Stay: Admit: 2022-08-01 | Payer: PRIVATE HEALTH INSURANCE

## 2022-08-01 DIAGNOSIS — O99322 Drug use complicating pregnancy, second trimester: Secondary | ICD-10-CM

## 2022-08-01 DIAGNOSIS — O355XX0 Maternal care for (suspected) damage to fetus by drugs, not applicable or unspecified: Secondary | ICD-10-CM

## 2022-08-09 NOTE — Telephone Encounter (Signed)
Pt called in to reschedule ROB with Dr. Cherylin Mylar for next week. Unable to see any openings. Pt is open to any location. Please advise.

## 2022-08-10 ENCOUNTER — Encounter: Payer: PRIVATE HEALTH INSURANCE | Attending: Obstetrics & Gynecology

## 2022-08-10 NOTE — Telephone Encounter (Signed)
Called pt to offer appt on 7/29 [place] with Dr. Cherylin Mylar at 1045am> no ans-unable to lvm> phone keep ringing> sent message thru Mychart

## 2022-08-28 ENCOUNTER — Encounter
Admit: 2022-08-28 | Discharge: 2022-08-28 | Payer: PRIVATE HEALTH INSURANCE | Attending: Student in an Organized Health Care Education/Training Program

## 2022-08-28 DIAGNOSIS — Z3482 Encounter for supervision of other normal pregnancy, second trimester: Secondary | ICD-10-CM

## 2022-08-28 MED ORDER — ONDANSETRON HCL 4 MG PO TABS
4 MG | ORAL_TABLET | Freq: Three times a day (TID) | ORAL | 1 refills | Status: AC | PRN
Start: 2022-08-28 — End: 2022-10-25

## 2022-08-28 NOTE — Progress Notes (Addendum)
ROUTINE OB VISIT    32 y.o. G2P1001 with IUP @ [redacted]w[redacted]d here for ROB care.     Vitals:    08/28/22 0921   BP: 104/70       - RH+ / Rubella Imm / H/H/Plt: 12.2/36.3/262  - 3rd tri labs next visit.   - Genetics: AFP neg, NT wnl, NIPT low risk  - Anatomy US 7/17: 66% (AC 69%), low lying placenta 1.84cm from cervix.   - Contraception: Desires tubal - counsel at 28wks.  - Girl     Opiate use disorder  -Continues on subutex.   -Anatomy US wnl.  -Encouraged continued compliance.   -Serial GS in 3rd trimester. ANT for usual obstetric indications per MFM.    H/o postpartum depression  -EPDS: 0, feels tired and lack of motivation. Wants referral to psychiatry - placed. No HI/SI. Precautions discussed.   6/28 Dr. Cherylin Mylar recommended engagement in [facility] Women's behavioral health as an adjunct to current therapy as she is at risk for recurrent postpartum depression.      THC use, h/o tobacco use  -NOB +UDS > reports she has quit   -Reports quit tobacco 3 years ago.    H/o shoulder dystocia  -Fractured clavicle. Weight between 7-8 lbs. Denies 3rd or 4th degree tears.   -SVD vs pLTCS - to discuss delivery planning further with primary OB, Dr. Cherylin Mylar.    Low lying placenta  -1.84cm from cervix > f/u US scheduled with MFM tomorrow.  -Pelvic rest    Unwanted fertility  -Desires tubal - counsel at 28wks    RTO in 4 weeks for ROB with 3rd tri labs. MFM [place] tomorrow. PTL precautions reviewed today. Patient was advised to call with any questions or concerns. Time taken to answer all questions and patient in agreement with plan of care.

## 2022-09-03 ENCOUNTER — Inpatient Hospital Stay: Admit: 2022-09-03 | Payer: PRIVATE HEALTH INSURANCE

## 2022-09-03 DIAGNOSIS — O99322 Drug use complicating pregnancy, second trimester: Secondary | ICD-10-CM

## 2022-09-03 DIAGNOSIS — O355XX0 Maternal care for (suspected) damage to fetus by drugs, not applicable or unspecified: Secondary | ICD-10-CM

## 2022-09-25 ENCOUNTER — Encounter: Payer: PRIVATE HEALTH INSURANCE | Attending: Student in an Organized Health Care Education/Training Program

## 2022-10-10 ENCOUNTER — Encounter
Admit: 2022-10-10 | Discharge: 2022-10-10 | Payer: PRIVATE HEALTH INSURANCE | Attending: Student in an Organized Health Care Education/Training Program

## 2022-10-10 VITALS — BP 122/80 | Ht 65.0 in | Wt <= 1120 oz

## 2022-10-10 DIAGNOSIS — Z3483 Encounter for supervision of other normal pregnancy, third trimester: Secondary | ICD-10-CM

## 2022-10-10 NOTE — Telephone Encounter (Signed)
 LVM REQUESTING PT TO CALL BACK TO SCHEDULE [place] ASAP, AND SCHEDULE WANNAMAKER IN 2 WEEKS

## 2022-10-10 NOTE — Progress Notes (Signed)
 ROUTINE OB VISIT    32 y.o. G2P1001 with IUP @ [redacted]w[redacted]d here for ROB care. Dr. Karyl patient. Has had a headache today, has not taken tyelnol yet. Saw MFM in August, states they no longer need to see her.     Vitals:    10/10/22 0843   BP: 122/80       - RH+ / Rubella Imm / H/H/Plt: 12.2/36.3/262  - 3rd tri labs ordered today   - S/p TDAP today   - Genetics: AFP neg, NT wnl, NIPT low risk  - Anatomy US  wnl except low lying placenta.   - GS 8/19 US : at 23 6/7wks: 65%, transverse, anterior placenta - no longer low lying, 2.2cm from os, normal fluid, CL 38.71mm > serial GS  - Contraception: Desires tubal - counseled and medicaid consent signed.   - Girl   - Headache - Encourage tylenol  PRN, can also try benadryl. Magnesium supplement. Encourage hydration. Precautions discussed.     R/B/A of bilateral tubal ligation vs salpingectomy discussed. Patient is 100% she is done having children. Risks include damage to surrounding organs, bleeding, and infection. She understands if injury were to occur, a larger incision may be required along with complications in postoperative course. Patient agrees to receive blood products if required to save her life. Patient understands that a tubal is not 100% effective and explained relative risk of ectopic pregnancy. Patient understands there are other reversible long term birth control options available. Patient understands tubal/salpingectomy will not assist with menstrual control. Patient verbalized understanding, all questions answered and patient elects to proceed with bilateral salpingectomy.     Opiate use disorder  -Continue on subutex .   -Serial GS. ANT for usual obstetric indications per MFM.    H/o postpartum depression  -Referral to psychiatry in August. No HI/SI. Precautions discussed.   6/28 Dr. Karyl recommended engagement in [facility] Women's behavioral health as an adjunct to current therapy as she is at risk for recurrent postpartum depression.      THC use, h/o  tobacco use  -NOB +UDS > reports she has quit   -Reports quit tobacco 3 years ago.    H/o shoulder dystocia  -Fractured clavicle. Weight between 7-8 lbs. Denies 3rd or 4th degree tears.   -SVD vs pLTCS - desires erLTCS - to discuss delivery planning further with primary OB, Dr. Karyl.    Low lying placenta - RESOLVED  -1.84cm from cervix > no longer low lying placenta on 8/19 GS    Unwanted fertility  -Desires tubal - counseling and consent on 9/25     RTO in 2 weeks for ROB. Schedule GS. PTL precautions reviewed today. Patient was advised to call with any questions or concerns. Time taken to answer all questions and patient in agreement with plan of care.

## 2022-10-11 NOTE — Telephone Encounter (Signed)
 LVM REQUESTING PATIENT TO CALL BACK TO SCHEDULE ULTRA SOUND AND APPOINTMENT WITH [facility]

## 2022-10-15 NOTE — Telephone Encounter (Signed)
 LVM REQUESTING PATIENT TO CALL BACK TO SCHEDULE ULTRA SOUND AND APPOINTMENT WITH [facility]

## 2022-10-15 NOTE — Telephone Encounter (Signed)
 PT called back regarding u/s and ROB. Pt is available tomorrow before 11am, or around 2pm to schedule. Pt is open to either [place] or [facility]. Unabel to see any openings this week/next. Please advise.

## 2022-10-16 NOTE — Telephone Encounter (Signed)
 Left Voice message offering Ultrasound Oct 2 at 9 am in the [place] office, waiting on Dr Jerilee Field MDR for scheduling follow up with physician.

## 2022-10-17 ENCOUNTER — Ambulatory Visit: Admit: 2022-10-17 | Discharge: 2022-10-17 | Payer: PRIVATE HEALTH INSURANCE

## 2022-10-17 DIAGNOSIS — Z3A29 29 weeks gestation of pregnancy: Secondary | ICD-10-CM

## 2022-10-17 NOTE — Telephone Encounter (Signed)
 Pt got schd for 10/7 after her u/s appt

## 2022-10-22 ENCOUNTER — Encounter: Admit: 2022-10-22 | Discharge: 2022-10-22 | Payer: PRIVATE HEALTH INSURANCE | Attending: Obstetrics & Gynecology

## 2022-10-22 DIAGNOSIS — Z3A30 30 weeks gestation of pregnancy: Secondary | ICD-10-CM

## 2022-10-22 NOTE — Progress Notes (Signed)
No new complaints, growth [place] reviewed    - RH+ / Rubella Imm / H/H/Plt: 12.2/36.3/262  - 3rd tri labs ordered today   - S/p TDAP today   - Genetics: AFP neg, NT wnl, NIPT low risk XX  - Anatomy US wnl except low lying placenta.   - GS 8/19 [place]: at 23 6/7wks: 65%, transverse, anterior placenta - no longer low lying, 2.2cm from os, normal fluid, CL 38.31mm > serial GS  - GS on 10/2 at 30 1/7wks: 82% (AC 94%), normal fluid, ant plac, vtx, BPP 8/8     Headache  - Encourage tylenol PRN, can also try benadryl. Magnesium supplement. Encourage hydration.      Opiate use disorder  - Continue on subutex.   - detailed anatomy scan normal  - Serial GS in 3rd trimester. ANT for usual obstetric indications per MFM.     H/o postpartum depression  - Referral to psychiatry in August. No HI/SI. Precautions discussed.   - recommended engagement in [facility]'s behavioral health as an adjunct to current therapy as she is at risk for recurrent postpartum depression.       THC use, h/o tobacco use  -NOB +UDS on 5/31 > reports she has quit   -Reports quit tobacco 3 years ago.     H/o shoulder dystocia  -Fractured clavicle. Weight between 7-8 lbs. Denies 3rd or 4th degree tears.   -SVD vs pLTCS - desires pLTCS - scheduled on 12/4 @ 7:30     Unwanted fertility - Desires tubal - counseled and medicaid consent signed  -Desires tubal - counseling and consent on 9/25 - R/B/A of bilateral tubal ligation vs salpingectomy discussed. Patient is 100% she is done having children.       RTO in 2 weeks for ROB. Schedule GS. PTL precautions reviewed today. Patient was advised to call with any questions or concerns. Time taken to answer all questions and patient in agreement with plan of care.

## 2022-10-24 ENCOUNTER — Encounter

## 2022-10-24 NOTE — Telephone Encounter (Signed)
Posting Slip completed for 12/24/1992 at 7:30 am and added to your calendar. Page sent to pt to notifiy.

## 2022-10-24 NOTE — Telephone Encounter (Signed)
-----   Message from Dr. Carita Pian, MD sent at 10/22/2022 12:44 PM EDT -----  Regarding: schedule surg  Please schedule for primary elective CS due to h/o shoulder dystocia with fetal complication  Date pref: 12/4

## 2022-10-24 NOTE — Telephone Encounter (Signed)
Mail box full. Paige sent

## 2022-10-25 MED ORDER — ONDANSETRON HCL 4 MG PO TABS
4 MG | ORAL_TABLET | Freq: Three times a day (TID) | ORAL | 0 refills | Status: DC | PRN
Start: 2022-10-25 — End: 2022-11-27

## 2022-10-25 NOTE — Telephone Encounter (Signed)
Mail box full, page sent. Dr Cherylin Mylar I have not been able to reach the pt can you please notify her at her next appt

## 2022-11-07 ENCOUNTER — Encounter: Admit: 2022-11-07 | Discharge: 2022-11-07 | Payer: PRIVATE HEALTH INSURANCE | Attending: Obstetrics & Gynecology

## 2022-11-07 DIAGNOSIS — O99320 Drug use complicating pregnancy, unspecified trimester: Secondary | ICD-10-CM

## 2022-11-07 LAB — CBC
Hematocrit: 31.3 % — ABNORMAL LOW (ref 34.0–47.0)
Hemoglobin: 10 g/dL — ABNORMAL LOW (ref 11.5–15.7)
MCH: 27 pg (ref 27.0–34.5)
MCHC: 31.9 g/dL (ref 30.0–36.0)
MCV: 84.4 fL (ref 81.0–99.0)
MPV: 11.3 fL (ref 7.0–12.2)
NRBC Absolute: 0 10*3/uL (ref 0.000–0.012)
NRBC Automated: 0 % (ref 0.0–0.2)
Platelets: 222 10*3/uL (ref 140–440)
RBC: 3.71 x10e6/mcL (ref 3.60–5.20)
RDW: 12.7 % (ref 10.0–17.0)
WBC: 10 10*3/uL (ref 3.8–10.6)

## 2022-11-07 LAB — GLUCOSE 1 HOUR POST GLUCOLA OB: GTT-OB 1hr: 100 mg/dL (ref 50–135)

## 2022-11-07 LAB — HIV SCREEN: HIV Screen: NEGATIVE

## 2022-11-07 NOTE — Progress Notes (Signed)
HA improved with mag and tylenol  Labs today including 1 hour gtt    GS on 10/2 at 30 1/7wks: 82% (AC 94%), normal fluid, ant plac, vtx, BPP 8/8      - RH+ / Rubella Imm / H/H/Plt: 12.2/36.3/262  - 3rd tri labs ordered today   - S/p TDAP today   - Genetics: AFP neg, NT wnl, NIPT low risk  - Anatomy US wnl except low lying placenta.   - GS 8/19 [place]: at 23 6/7wks: 65%, transverse, anterior placenta - no longer low lying, 2.2cm from os, normal fluid, CL 38.3mm > serial GS  - Contraception: Desires tubal - counseled and medicaid consent signed.   - Girl      Opiate use disorder  -Continue on subutex.   -Serial GS. ANT for usual obstetric indications per MFM.     H/o postpartum depression  -Referral to psychiatry in August. No HI/SI. Precautions discussed.   6/28 Dr. Cherylin Mylar recommended engagement in [facility] behavioral health as an adjunct to current therapy as she is at risk for recurrent postpartum depression.       THC use, h/o tobacco use  -NOB +UDS > reports she has quit   -Reports quit tobacco 3 years ago.     H/o shoulder dystocia  -Fractured clavicle. Weight between 7-8 lbs. Denies 3rd or 4th degree tears.   -SVD vs pLTCS - desires erLTCS - scheduled 12/19/22     Low lying placenta - RESOLVED  -1.84cm from cervix > no longer low lying placenta on 8/19 GS     Unwanted fertility  -Desires tubal - counseling and consent on 9/25      RTO in 2 weeks for ROB. Schedule GS. PTL precautions reviewed today. Patient was advised to call with any questions or concerns. Time taken to answer all questions and patient in agreement with plan of care.

## 2022-11-09 LAB — RPR: RPR: NONREACTIVE

## 2022-11-09 NOTE — Telephone Encounter (Signed)
Pt returning call regarding results    Informed office is closed for the day and someone will call back on Monday.Lauren Rivers

## 2022-11-13 ENCOUNTER — Encounter

## 2022-11-13 ENCOUNTER — Ambulatory Visit: Admit: 2022-11-13 | Discharge: 2022-11-13 | Payer: PRIVATE HEALTH INSURANCE

## 2022-11-13 ENCOUNTER — Encounter: Admit: 2022-11-13 | Discharge: 2022-11-13 | Payer: PRIVATE HEALTH INSURANCE | Attending: Obstetrics & Gynecology

## 2022-11-13 DIAGNOSIS — F1120 Opioid dependence, uncomplicated: Secondary | ICD-10-CM

## 2022-11-13 DIAGNOSIS — Z3A34 34 weeks gestation of pregnancy: Secondary | ICD-10-CM

## 2022-11-13 NOTE — Progress Notes (Unsigned)
HA improved with mag and tylenol  Labs today including 1 hour gtt    GS on 10/2 at 30 1/7wks: 82% (AC 94%), normal fluid, ant plac, vtx, BPP 8/8      - RH+ / Rubella Imm / H/H/Plt: 12.2/36.3/262  - 3rd tri labs - Anemia 10/23 10/31.3 -. Rec iron  - S/p TDAP today   - Genetics: AFP neg, NT wnl, NIPT low risk  - Anatomy US wnl except low lying placenta.   - GS 8/19 [place]: at 23 6/7wks: 65%, transverse, anterior placenta - no longer low lying, 2.2cm from os, normal fluid, CL 38.34mm > serial GS  - Contraception: Desires tubal - counseled and medicaid consent signed.   - Girl      Opiate use disorder  -Continue on subutex.   -Serial GS. ANT for usual obstetric indications per MFM.     H/o postpartum depression  -Referral to psychiatry in August. No HI/SI. Precautions discussed.   6/28 Dr. Cherylin Mylar recommended engagement in [facility] Women's behavioral health as an adjunct to current therapy as she is at risk for recurrent postpartum depression.       THC use, h/o tobacco use  -NOB +UDS > reports she has quit   -Reports quit tobacco 3 years ago.     H/o shoulder dystocia  -Fractured clavicle. Weight between 7-8 lbs. Denies 3rd or 4th degree tears.   -SVD vs pLTCS - desires erLTCS - CS on 12/19/22 at 7:30 am      Low lying placenta - RESOLVED  -1.84cm from cervix > no longer low lying placenta on 8/19 GS     Unwanted fertility   -Desires tubal - counseling and consent on 9/25      RTO in 2 weeks for ROB. Schedule GS. PTL precautions reviewed today. Patient was advised to call with any questions or concerns. Time taken to answer all questions and patient in agreement with plan of care.

## 2022-11-16 NOTE — Telephone Encounter (Signed)
No return phone call.

## 2022-11-25 ENCOUNTER — Encounter

## 2022-11-27 ENCOUNTER — Encounter

## 2022-11-27 MED ORDER — ONDANSETRON HCL 4 MG PO TABS
4 MG | ORAL_TABLET | Freq: Three times a day (TID) | ORAL | 0 refills | Status: DC | PRN
Start: 2022-11-27 — End: 2022-12-21

## 2022-11-27 NOTE — Telephone Encounter (Signed)
 Pt requested a refill for Zofran. Pharmacy confirmed [facility]. Informed up to 24-48 hrs.

## 2022-11-30 ENCOUNTER — Encounter: Admit: 2022-11-30 | Admitting: Obstetrics & Gynecology

## 2022-11-30 ENCOUNTER — Encounter
Admit: 2022-11-30 | Discharge: 2022-11-30 | Payer: Medicaid (Managed Care) | Attending: Obstetrics & Gynecology | Admitting: Obstetrics & Gynecology

## 2022-11-30 VITALS — BP 114/76 | Ht 65.0 in | Wt 182.0 lb

## 2022-11-30 DIAGNOSIS — Z3483 Encounter for supervision of other normal pregnancy, third trimester: Secondary | ICD-10-CM

## 2022-11-30 NOTE — Progress Notes (Signed)
 Growth - EFW 2559g (71.8%) 10/30  GBS today     PNLs wnl.   - Genetics: AFP neg, NT wnl, NIPT low risk  - Anatomy US wnl except low lying placenta.      Opiate use disorder  -Continue on subutex.   -Serial GS. ANT for usual obstetric indications per MFM.

## 2022-12-03 LAB — GROUP B STREP BY NAA: Strep Group B NAA: NEGATIVE

## 2022-12-07 ENCOUNTER — Encounter: Attending: Obstetrics & Gynecology

## 2022-12-12 ENCOUNTER — Encounter: Admit: 2022-12-12 | Payer: PRIVATE HEALTH INSURANCE | Admitting: Obstetrics & Gynecology

## 2022-12-12 ENCOUNTER — Ambulatory Visit: Admit: 2022-12-12 | Payer: PRIVATE HEALTH INSURANCE

## 2022-12-12 VITALS — BP 104/80 | Ht 65.0 in | Wt 182.0 lb

## 2022-12-12 DIAGNOSIS — Z3A38 38 weeks gestation of pregnancy: Secondary | ICD-10-CM

## 2022-12-12 DIAGNOSIS — O99320 Drug use complicating pregnancy, unspecified trimester: Secondary | ICD-10-CM

## 2022-12-12 DIAGNOSIS — F1120 Opioid dependence, uncomplicated: Principal | ICD-10-CM

## 2022-12-12 NOTE — Progress Notes (Signed)
 Growth - EFW 3704g (84.5%) on 11/27, BPP 8/8  No contractions, just pressure  GBS negative     PNLs wnl.   - Genetics: AFP neg, NT wnl, NIPT low risk  - Anatomy US wnl except low lying placenta.      Opiate use disorder  -Continue on subutex.   -Serial GS.

## 2022-12-18 NOTE — H&P (Signed)
 HISTORY AND PHYSICAL             Date: 12/18/2022        Patient Name: Lauren Rivers     Date of Birth: April 10, 1990      Age:  32 y.o.    Chief Complaint      "scheduled CS"    History Obtained From   patient    History of Present Illness   32yo G2P1001

## 2022-12-19 ENCOUNTER — Inpatient Hospital Stay
Admit: 2022-12-19 | Discharge: 2022-12-21 | Disposition: A | Discharge status: Home / Self Care | Payer: PRIVATE HEALTH INSURANCE | Attending: Obstetrics & Gynecology | Admitting: Obstetrics & Gynecology

## 2022-12-19 DIAGNOSIS — O909 Complication of the puerperium, unspecified: Secondary | ICD-10-CM

## 2022-12-19 DIAGNOSIS — Z3A39 39 weeks gestation of pregnancy: Secondary | ICD-10-CM

## 2022-12-19 DIAGNOSIS — O99324 Drug use complicating childbirth: Secondary | ICD-10-CM

## 2022-12-19 LAB — ANTIBODY SCREEN: Antibody Screen: NEGATIVE

## 2022-12-19 LAB — URINE DRUG SCREEN
Amphetamine Screen, Ur: NEGATIVE
Barbiturate Screen, Ur: NEGATIVE
Benzodiazepine Screen, Urine: NEGATIVE
Cannabinoid Scrn, Ur: NEGATIVE
Cocaine Screen Urine: NEGATIVE
Fentanyl, Ur: NEGATIVE
Opiate Screen, Urine: NEGATIVE

## 2022-12-19 LAB — CBC
Hematocrit: 33.3 % — ABNORMAL LOW (ref 34.0–47.0)
Hemoglobin: 10.7 g/dL — ABNORMAL LOW (ref 11.5–15.7)
MCH: 26.4 pg — ABNORMAL LOW (ref 27.0–34.5)
MCHC: 32.1 g/dL (ref 30.0–36.0)
MCV: 82.2 fL (ref 81.0–99.0)
MPV: 11.8 fL (ref 7.0–12.2)
Platelets: 208 10*3/uL (ref 140–440)
RBC: 4.05 x10e6/mcL (ref 3.60–5.20)
RDW: 14 % (ref 10.0–17.0)
WBC: 10.2 10*3/uL (ref 3.8–10.6)

## 2022-12-19 LAB — RPR: RPR: NONREACTIVE

## 2022-12-19 LAB — ABO/RH: ABO/Rh: A POS

## 2022-12-19 MED ORDER — PHENYLEPHRINE HCL 1 MG/10ML IV SOSY
1 | Freq: Once | INTRAVENOUS | Status: DC | PRN
Start: 2022-12-19 — End: 2022-12-19
  Administered 2022-12-19: 13:00:00 100 via INTRAVENOUS

## 2022-12-19 MED ORDER — FERROUS SULFATE 325 (65 FE) MG PO TABS
325 | ORAL | Status: DC
Start: 2022-12-19 — End: 2022-12-21
  Administered 2022-12-19: 19:00:00 325 mg via ORAL

## 2022-12-19 MED ORDER — ACETAMINOPHEN 325 MG PO TABS
325 | Freq: Once | ORAL | Status: AC
Start: 2022-12-19 — End: 2022-12-19
  Administered 2022-12-19: 12:00:00 975 mg via ORAL

## 2022-12-19 MED ORDER — SOD CITRATE-CITRIC ACID 500-334 MG/5ML PO SOLN
500-334 | Freq: Once | ORAL | Status: AC
Start: 2022-12-19 — End: 2022-12-19
  Administered 2022-12-19: 12:00:00 30 mL via ORAL

## 2022-12-19 MED ORDER — ACETAMINOPHEN 500 MG PO TABS
500 | Freq: Three times a day (TID) | ORAL | Status: DC
Start: 2022-12-19 — End: 2022-12-21
  Administered 2022-12-19 – 2022-12-21 (×6): 1000 mg via ORAL

## 2022-12-19 MED ORDER — SODIUM CHLORIDE 0.9 % IV SOLN
0.9 | INTRAVENOUS | Status: DC | PRN
Start: 2022-12-19 — End: 2022-12-19

## 2022-12-19 MED ORDER — KETOROLAC TROMETHAMINE 30 MG/ML IJ SOLN
30 | Freq: Once | INTRAMUSCULAR | Status: DC | PRN
Start: 2022-12-19 — End: 2022-12-19
  Administered 2022-12-19: 14:00:00 30 via INTRAVENOUS

## 2022-12-19 MED ORDER — ONDANSETRON HCL 4 MG/2ML IJ SOLN
4 | INTRAMUSCULAR | Status: AC
Start: 2022-12-19 — End: ?

## 2022-12-19 MED ORDER — OXYTOCIN 30 UNITS IN 500 ML INFUSION
30 | INTRAVENOUS | Status: AC
Start: 2022-12-19 — End: ?

## 2022-12-19 MED ORDER — SODIUM CHLORIDE (PF) 0.9 % IJ SOLN
0.9 | INTRAMUSCULAR | Status: DC | PRN
Start: 2022-12-19 — End: 2022-12-19

## 2022-12-19 MED ORDER — OXYCODONE HCL 5 MG PO TABS
5 MG | ORAL | Status: AC | PRN
Start: 2022-12-19 — End: 2022-12-21

## 2022-12-19 MED ORDER — FAMOTIDINE 20 MG PO TABS
20 | Freq: Two times a day (BID) | ORAL | Status: DC | PRN
Start: 2022-12-19 — End: 2022-12-21

## 2022-12-19 MED ORDER — NORMAL SALINE FLUSH 0.9 % IV SOLN
0.9 | Freq: Two times a day (BID) | INTRAVENOUS | Status: DC
Start: 2022-12-19 — End: 2022-12-21

## 2022-12-19 MED ORDER — SOD CITRATE-CITRIC ACID 500-334 MG/5ML PO SOLN
500-334 | Freq: Once | ORAL | Status: DC
Start: 2022-12-19 — End: 2022-12-19

## 2022-12-19 MED ORDER — MISOPROSTOL 200 MCG PO TABS
200 MCG | ORAL | Status: AC | PRN
Start: 2022-12-19 — End: 2022-12-21

## 2022-12-19 MED ORDER — SODIUM CHLORIDE (PF) 0.9 % IJ SOLN
0.9 | Freq: Once | INTRAMUSCULAR | Status: AC
Start: 2022-12-19 — End: 2022-12-19
  Administered 2022-12-19: 12:00:00 20 mg via INTRAVENOUS

## 2022-12-19 MED ORDER — MORPHINE SULFATE (PF) 1 MG/ML IJ SOLN
1 | INTRAMUSCULAR | Status: AC
Start: 2022-12-19 — End: 2022-12-19
  Administered 2022-12-19: 13:00:00 .2 via INTRATHECAL

## 2022-12-19 MED ORDER — GABAPENTIN 300 MG PO CAPS
300 | Freq: Once | ORAL | Status: AC
Start: 2022-12-19 — End: 2022-12-19
  Administered 2022-12-19: 12:00:00 300 mg via ORAL

## 2022-12-19 MED ORDER — OXYTOCIN 30 UNITS IN 500 ML INFUSION
30 | INTRAVENOUS | Status: AC | PRN
Start: 2022-12-19 — End: 2022-12-21
  Administered 2022-12-19: 13:00:00 999 m[IU]/min via INTRAVENOUS
  Administered 2022-12-19 (×2): 333 m[IU]/min via INTRAVENOUS

## 2022-12-19 MED ORDER — MISOPROSTOL 200 MCG PO TABS
200 | ORAL | Status: DC | PRN
Start: 2022-12-19 — End: 2022-12-21

## 2022-12-19 MED ORDER — SODIUM CHLORIDE 0.9 % IV SOLN
0.9 % | INTRAVENOUS | Status: AC | PRN
Start: 2022-12-19 — End: 2022-12-21

## 2022-12-19 MED ORDER — FENTANYL CITRATE (PF) 100 MCG/2ML IJ SOLN
100 | INTRAMUSCULAR | Status: DC | PRN
Start: 2022-12-19 — End: 2022-12-19
  Administered 2022-12-19: 16:00:00 25 ug via INTRAVENOUS

## 2022-12-19 MED ORDER — NORMAL SALINE FLUSH 0.9 % IV SOLN
0.9 | INTRAVENOUS | Status: DC | PRN
Start: 2022-12-19 — End: 2022-12-21

## 2022-12-19 MED ORDER — NORMAL SALINE FLUSH 0.9 % IV SOLN
0.9 | INTRAVENOUS | Status: DC | PRN
Start: 2022-12-19 — End: 2022-12-19

## 2022-12-19 MED ORDER — GLYCOPYRROLATE 1 MG/5ML IV SOSY
1 | Freq: Once | INTRAVENOUS | Status: DC | PRN
Start: 2022-12-19 — End: 2022-12-19
  Administered 2022-12-19: 13:00:00 .1 via INTRAVENOUS

## 2022-12-19 MED ORDER — KETOROLAC TROMETHAMINE 30 MG/ML IJ SOLN
30 | INTRAMUSCULAR | Status: AC
Start: 2022-12-19 — End: ?

## 2022-12-19 MED ORDER — BISACODYL 10 MG RE SUPP
10 MG | Freq: Every day | RECTAL | Status: AC | PRN
Start: 2022-12-19 — End: 2022-12-21

## 2022-12-19 MED ORDER — DOCUSATE SODIUM 100 MG PO CAPS
100 | Freq: Two times a day (BID) | ORAL | Status: DC
Start: 2022-12-19 — End: 2022-12-21
  Administered 2022-12-19 – 2022-12-21 (×5): 100 mg via ORAL

## 2022-12-19 MED ORDER — HYDROMORPHONE HCL PF 2 MG/ML IJ SOLN
2 | INTRAMUSCULAR | Status: DC | PRN
Start: 2022-12-19 — End: 2022-12-19

## 2022-12-19 MED ORDER — LACTATED RINGERS IV BOLUS
Freq: Once | INTRAVENOUS | Status: AC
Start: 2022-12-19 — End: 2022-12-19
  Administered 2022-12-19: 11:00:00 1000 mL via INTRAVENOUS

## 2022-12-19 MED ORDER — POLYETHYLENE GLYCOL 3350 17 G PO PACK
17 g | Freq: Every day | ORAL | Status: AC
Start: 2022-12-19 — End: 2022-12-21
  Administered 2022-12-21: 14:00:00 17 g via ORAL

## 2022-12-19 MED ORDER — OXYTOCIN 0.06 UNIT/ML BOLUS FROM THE BAG (POST-PARTUM)
30 | INTRAVENOUS | Status: DC | PRN
Start: 2022-12-19 — End: 2022-12-21

## 2022-12-19 MED ORDER — NORMAL SALINE FLUSH 0.9 % IV SOLN
0.9 | Freq: Two times a day (BID) | INTRAVENOUS | Status: DC
Start: 2022-12-19 — End: 2022-12-19

## 2022-12-19 MED ORDER — DEXTROSE IN LACTATED RINGERS 5 % IV SOLN
5 | INTRAVENOUS | Status: AC
Start: 2022-12-19 — End: 2022-12-20
  Administered 2022-12-19: 17:00:00 via INTRAVENOUS

## 2022-12-19 MED ORDER — SIMETHICONE 80 MG PO CHEW
80 | Freq: Four times a day (QID) | ORAL | Status: DC | PRN
Start: 2022-12-19 — End: 2022-12-21

## 2022-12-19 MED ORDER — LANOLIN-PETROLATUM 15.5-53.4 % EX OINT
CUTANEOUS | Status: DC | PRN
Start: 2022-12-19 — End: 2022-12-21

## 2022-12-19 MED ORDER — IBUPROFEN 800 MG PO TABS
800 | Freq: Three times a day (TID) | ORAL | Status: DC
Start: 2022-12-19 — End: 2022-12-21
  Administered 2022-12-20 – 2022-12-21 (×3): 800 mg via ORAL

## 2022-12-19 MED ORDER — FENTANYL CITRATE (PF) 100 MCG/2ML IJ SOLN
100 | INTRAMUSCULAR | Status: AC
Start: 2022-12-19 — End: ?

## 2022-12-19 MED ORDER — ONDANSETRON HCL 4 MG/2ML IJ SOLN
4 | Freq: Four times a day (QID) | INTRAMUSCULAR | Status: DC | PRN
Start: 2022-12-19 — End: 2022-12-21

## 2022-12-19 MED ORDER — GLYCOPYRROLATE 1 MG/5ML IV SOSY
1 | INTRAVENOUS | Status: AC
Start: 2022-12-19 — End: ?

## 2022-12-19 MED ORDER — ONDANSETRON HCL 4 MG/2ML IJ SOLN
4 | Freq: Once | INTRAMUSCULAR | Status: DC | PRN
Start: 2022-12-19 — End: 2022-12-19
  Administered 2022-12-19: 13:00:00 8 via INTRAVENOUS

## 2022-12-19 MED ORDER — OXYCODONE HCL 5 MG PO TABS
5 | ORAL | Status: DC | PRN
Start: 2022-12-19 — End: 2022-12-21
  Administered 2022-12-20 – 2022-12-21 (×5): 5 mg via ORAL

## 2022-12-19 MED ORDER — METHYLERGONOVINE MALEATE 0.2 MG/ML IJ SOLN
0.2 MG/ML | INTRAMUSCULAR | Status: AC | PRN
Start: 2022-12-19 — End: 2022-12-21

## 2022-12-19 MED ORDER — OXYCODONE HCL 5 MG PO TABS
5 | Freq: Once | ORAL | Status: DC | PRN
Start: 2022-12-19 — End: 2022-12-19

## 2022-12-19 MED ORDER — ONDANSETRON HCL 4 MG/2ML IJ SOLN
4 | Freq: Once | INTRAMUSCULAR | Status: DC | PRN
Start: 2022-12-19 — End: 2022-12-19

## 2022-12-19 MED ORDER — ONDANSETRON HCL 4 MG/2ML IJ SOLN
4 | Freq: Four times a day (QID) | INTRAMUSCULAR | Status: DC | PRN
Start: 2022-12-19 — End: 2022-12-19

## 2022-12-19 MED ORDER — BUPRENORPHINE HCL 8 MG SL SUBL
8 | Freq: Every day | SUBLINGUAL | Status: DC
Start: 2022-12-19 — End: 2022-12-21
  Administered 2022-12-20 – 2022-12-21 (×2): 8 mg via SUBLINGUAL

## 2022-12-19 MED ORDER — FENTANYL CITRATE (PF) 100 MCG/2ML IJ SOLN
100 | INTRAMUSCULAR | Status: AC
Start: 2022-12-19 — End: 2022-12-19
  Administered 2022-12-19: 13:00:00 10 via INTRATHECAL

## 2022-12-19 MED ORDER — BUPIVACAINE IN DEXTROSE 0.75-8.25 % IT SOLN
INTRATHECAL | Status: AC
Start: 2022-12-19 — End: 2022-12-19
  Administered 2022-12-19: 13:00:00 11.25 via INTRATHECAL

## 2022-12-19 MED ORDER — ONDANSETRON 4 MG PO TBDP
4 MG | Freq: Three times a day (TID) | ORAL | Status: AC | PRN
Start: 2022-12-19 — End: 2022-12-21

## 2022-12-19 MED ORDER — PHENYLEPHRINE HCL 1 MG/10ML IV SOSY
1 | INTRAVENOUS | Status: AC
Start: 2022-12-19 — End: ?

## 2022-12-19 MED ORDER — CARBOPROST TROMETHAMINE 250 MCG/ML IM SOLN
250 | INTRAMUSCULAR | Status: DC | PRN
Start: 2022-12-19 — End: 2022-12-21

## 2022-12-19 MED ORDER — CEFAZOLIN SODIUM 2 G SOLR (MIXTURES ONLY)
2 g | Freq: Once | Status: AC
Start: 2022-12-19 — End: 2022-12-19
  Administered 2022-12-19: 12:00:00 2000 mg via INTRAVENOUS

## 2022-12-19 MED ORDER — LACTATED RINGERS IV SOLN
INTRAVENOUS | Status: DC
Start: 2022-12-19 — End: 2022-12-19
  Administered 2022-12-19 (×2): via INTRAVENOUS

## 2022-12-19 MED ORDER — MORPHINE SULFATE (PF) 1 MG/ML IJ SOLN
1 | INTRAMUSCULAR | Status: AC
Start: 2022-12-19 — End: ?

## 2022-12-19 MED ORDER — SODIUM CHLORIDE (PF) 0.9 % IJ SOLN
0.9 | Freq: Once | INTRAMUSCULAR | Status: DC
Start: 2022-12-19 — End: 2022-12-19

## 2022-12-19 MED ORDER — KETOROLAC TROMETHAMINE 30 MG/ML IJ SOLN
30 | Freq: Four times a day (QID) | INTRAMUSCULAR | Status: AC
Start: 2022-12-19 — End: 2022-12-20
  Administered 2022-12-19 – 2022-12-20 (×3): 30 mg via INTRAVENOUS

## 2022-12-19 MED FILL — SOD CITRATE-CITRIC ACID 500-334 MG/5ML PO SOLN: 500-334 MG/5ML | ORAL | Qty: 30

## 2022-12-19 MED FILL — ACETAMINOPHEN 325 MG PO TABS: 325 MG | ORAL | Qty: 3

## 2022-12-19 MED FILL — CEFAZOLIN SODIUM 2 G IJ SOLR: 2 g | INTRAMUSCULAR | Qty: 2000

## 2022-12-19 MED FILL — FENTANYL CITRATE (PF) 100 MCG/2ML IJ SOLN: 100 MCG/2ML | INTRAMUSCULAR | Qty: 2

## 2022-12-19 MED FILL — GLYCOPYRROLATE 1 MG/5ML IV SOSY: 1 MG/5ML | INTRAVENOUS | Qty: 5

## 2022-12-19 MED FILL — KETOROLAC TROMETHAMINE 30 MG/ML IJ SOLN: 30 MG/ML | INTRAMUSCULAR | Qty: 1

## 2022-12-19 MED FILL — FAMOTIDINE (PF) 20 MG/2ML IV SOLN: 20 MG/2ML | INTRAVENOUS | Qty: 2

## 2022-12-19 MED FILL — ONDANSETRON HCL 4 MG/2ML IJ SOLN: 4 MG/2ML | INTRAMUSCULAR | Qty: 2

## 2022-12-19 MED FILL — MORPHINE SULFATE (PF) 1 MG/ML IJ SOLN: 1 MG/ML | INTRAMUSCULAR | Qty: 10

## 2022-12-19 MED FILL — PHENYLEPHRINE HCL (PRESSORS) 1 MG/10ML IV SOSY: 1 MG/0ML | INTRAVENOUS | Qty: 10

## 2022-12-19 MED FILL — FEROSUL 325 (65 FE) MG PO TABS: 325 (65 Fe) MG | ORAL | Qty: 1

## 2022-12-19 MED FILL — OXYTOCIN-SODIUM CHLORIDE 30-0.9 UT/500ML-% IV SOLN: INTRAVENOUS | Qty: 500

## 2022-12-19 MED FILL — NORMAL SALINE FLUSH 0.9 % IV SOLN: 0.9 % | INTRAVENOUS | Qty: 10

## 2022-12-19 MED FILL — ACETAMINOPHEN EXTRA STRENGTH 500 MG PO TABS: 500 MG | ORAL | Qty: 2

## 2022-12-19 MED FILL — DOCUSATE SODIUM 100 MG PO CAPS: 100 MG | ORAL | Qty: 1

## 2022-12-19 MED FILL — GABAPENTIN 300 MG PO CAPS: 300 MG | ORAL | Qty: 1

## 2022-12-19 NOTE — Op Note (Signed)
 OPERATIVE NOTE:  CESAREAN SECTION / PFANNENSTIEL    PREOPERATIVE DIAGNOSIS: [redacted] weeks gestation, history of shoulder dystocia, encounter for permanent sterilization    POSTOPERATIVE DIAGNOSIS: same    PROCEDURE: Low transverse cesarean section, bilateral sa

## 2022-12-19 NOTE — Plan of Care (Signed)
 Problem: Pain  Goal: Verbalizes/displays adequate comfort level or baseline comfort level  Outcome: Completed  Flowsheets (Taken 12/19/2022 1535 by Silver, Tilford Pillar, RN)  Verbalizes/displays adequate comfort level or baseline comfort level:   Encourage pa

## 2022-12-19 NOTE — Plan of Care (Signed)
 Initiated care plans  ?

## 2022-12-19 NOTE — H&P (Signed)
 HISTORY AND PHYSICAL             Date: 12/19/2022        Patient Name: Lauren Rivers     Date of Birth: 01/13/1991      Age:  32 y.o.    Chief Complaint      "scheduled CS"    History Obtained From   patient    History of Present Illness   32yo G2P1001

## 2022-12-19 NOTE — Plan of Care (Signed)
 Problem: Postpartum  Goal: Experiences normal postpartum course  Description:  Postpartum OB-Pregnancy care plan goal which identifies if the mother is experiencing a normal postpartum course  Outcome: Progressing  Flowsheets (Taken 12/19/2022 2030)  Expe

## 2022-12-19 NOTE — Anesthesia Post-Procedure Evaluation (Signed)
 Department of Anesthesiology  Postprocedure Note    Patient: Lauren Rivers  MRN: [id number]  Birthdate: 01/06/1991  Date of evaluation: 12/19/2022    Procedure Summary       Date: 12/19/22 Room / Location: [facility] LD OR 01 / [facility] L&D OR    Anesthesia Start:

## 2022-12-19 NOTE — Anesthesia Pre-Procedure Evaluation (Signed)
 Department of Anesthesiology  Preprocedure Note       Name:  Lauren Rivers   Age:  32 y.o.  DOB:  1990/04/05                                          MRN:  [id number]         Date:  12/19/2022      Surgeon: Moishe Spice):  Birdena Jubilee, MD    Proce

## 2022-12-19 NOTE — Anesthesia Procedure Notes (Addendum)
 Spinal Block    Patient location during procedure: OR  End time: 12/19/2022 7:46 AM  Reason for block: primary anesthetic  Staffing  Performed: resident/CRNA   Anesthesiologist: Tyler Pita, MD  Resident/CRNA: Dot Been, APRN - CRNA  Performed by:

## 2022-12-20 LAB — CBC
Hematocrit: 30.1 % — ABNORMAL LOW (ref 34.0–47.0)
Hemoglobin: 9.5 g/dL — ABNORMAL LOW (ref 11.5–15.7)
MCH: 26.5 pg — ABNORMAL LOW (ref 27.0–34.5)
MCHC: 31.6 g/dL (ref 30.0–36.0)
MCV: 83.8 fL (ref 81.0–99.0)
MPV: 10.9 fL (ref 7.0–12.2)
Platelets: 154 10*3/uL (ref 140–440)
RBC: 3.59 x10e6/mcL — ABNORMAL LOW (ref 3.60–5.20)
RDW: 14.5 % (ref 10.0–17.0)
WBC: 9.9 10*3/uL (ref 3.8–10.6)

## 2022-12-20 MED FILL — OXYCODONE HCL 5 MG PO TABS: 5 MG | ORAL | Qty: 1

## 2022-12-20 MED FILL — ACETAMINOPHEN EXTRA STRENGTH 500 MG PO TABS: 500 MG | ORAL | Qty: 2

## 2022-12-20 MED FILL — DOCUSATE SODIUM 100 MG PO CAPS: 100 MG | ORAL | Qty: 1

## 2022-12-20 MED FILL — BUPRENORPHINE HCL 8 MG SL SUBL: 8 MG | SUBLINGUAL | Qty: 1

## 2022-12-20 MED FILL — IBUPROFEN 800 MG PO TABS: 800 MG | ORAL | Qty: 1

## 2022-12-20 MED FILL — KETOROLAC TROMETHAMINE 30 MG/ML IJ SOLN: 30 MG/ML | INTRAMUSCULAR | Qty: 1

## 2022-12-20 MED FILL — NORMAL SALINE FLUSH 0.9 % IV SOLN: 0.9 % | INTRAVENOUS | Qty: 20

## 2022-12-20 NOTE — Plan of Care (Signed)
 Problem: Postpartum  Goal: Experiences normal postpartum course  Description:  Postpartum OB-Pregnancy care plan goal which identifies if the mother is experiencing a normal postpartum course  Outcome: Progressing  Flowsheets (Taken 12/20/2022 0825)  Expe

## 2022-12-20 NOTE — Progress Notes (Addendum)
 Progress Note  Date:12/20/2022       Room:2213/01  Patient Name:Lauren Rivers     Date of Birth:01-Jan-1991     Age:32 y.o.        Subjective    Subjective:  Symptoms:  Stable.  No shortness of breath or chest pain.    Diet:  Adequate intake.  No nausea

## 2022-12-20 NOTE — Plan of Care (Signed)
 Problem: Postpartum  Goal: Experiences normal postpartum course  Description:  Postpartum OB-Pregnancy care plan goal which identifies if the mother is experiencing a normal postpartum course  12/20/2022 2143 by Clearnce Hasten, RN  Outcome: Progressi

## 2022-12-21 MED ORDER — OXYCODONE HCL 5 MG PO TABS
5 | ORAL_TABLET | Freq: Four times a day (QID) | ORAL | 0 refills | Status: AC | PRN
Start: 2022-12-21 — End: 2022-12-24

## 2022-12-21 MED ORDER — NALOXONE HCL 4 MG/0.1ML NA LIQD
4 | NASAL | 0 refills | Status: AC | PRN
Start: 2022-12-21 — End: ?

## 2022-12-21 MED ORDER — IBUPROFEN 800 MG PO TABS
800 | ORAL_TABLET | Freq: Three times a day (TID) | ORAL | 1 refills | Status: AC
Start: 2022-12-21 — End: ?

## 2022-12-21 MED ORDER — DSS 100 MG PO CAPS
100 | ORAL_CAPSULE | Freq: Two times a day (BID) | ORAL | 1 refills | Status: AC
Start: 2022-12-21 — End: ?

## 2022-12-21 MED FILL — PEG 3350 17 G PO PACK: 17 g | ORAL | Qty: 1 | Fill #0

## 2022-12-21 MED FILL — FEROSUL 325 (65 FE) MG PO TABS: 325 (65 Fe) MG | ORAL | Qty: 1 | Fill #0

## 2022-12-21 MED FILL — ACETAMINOPHEN EXTRA STRENGTH 500 MG PO TABS: 500 MG | ORAL | Qty: 2

## 2022-12-21 MED FILL — DOCUSATE SODIUM 100 MG PO CAPS: 100 MG | ORAL | Qty: 1 | Fill #0

## 2022-12-21 MED FILL — IBUPROFEN 800 MG PO TABS: 800 MG | ORAL | Qty: 1

## 2022-12-21 MED FILL — ACETAMINOPHEN EXTRA STRENGTH 500 MG PO TABS: 500 MG | ORAL | Qty: 2 | Fill #0

## 2022-12-21 MED FILL — IBUPROFEN 800 MG PO TABS: 800 MG | ORAL | Qty: 1 | Fill #0

## 2022-12-21 MED FILL — DOCUSATE SODIUM 100 MG PO CAPS: 100 MG | ORAL | Qty: 1

## 2022-12-21 MED FILL — BUPRENORPHINE HCL 8 MG SL SUBL: 8 MG | SUBLINGUAL | Qty: 1 | Fill #0

## 2022-12-21 MED FILL — OXYCODONE HCL 5 MG PO TABS: 5 MG | ORAL | Qty: 1

## 2022-12-21 NOTE — Discharge Summary (Signed)
 Discharge Summary    Date: 12/21/2022  Patient Name: Lauren Rivers    Date of Birth: 09/22/1990     Age: 32 y.o.    Admit Date: 12/19/2022  Discharge Date: 12/21/2022  Discharge Condition: Good    Admission Diagnosis  [redacted] weeks gestation of pregnancy [Z3A

## 2022-12-21 NOTE — Progress Notes (Signed)
 Progress Note  Date:12/21/2022       Room:2213/01  Patient Name:Lauren Rivers     Date of Birth:[birth year]     Age:32 y.o.        Subjective    Subjective:  Symptoms:  Stable.  No shortness of breath or chest pain.    Diet:  Adequate intake.  No nausea

## 2022-12-25 ENCOUNTER — Encounter: Admit: 2022-12-25 | Payer: PRIVATE HEALTH INSURANCE | Admitting: Obstetrics & Gynecology

## 2023-01-03 ENCOUNTER — Encounter: Payer: PRIVATE HEALTH INSURANCE | Attending: Obstetrics & Gynecology

## 2023-01-23 NOTE — Progress Notes (Signed)
Subjective:      Lauren Rivers is a 33 y.o. female who presents 6 days post partum following a low cervical transverse Cesarean section for BP check. I have fully reviewed the prenatal and intrapartum course. The delivery was at 39 gestational weeks. Outcome: PCS.  Anesthesia: Spinal.  Postpartum course has been complicated by HTN.  Baby's course has been doing well without problems. Baby is bottle feeding .  Bleeding thin lochia.  Bowel function is normal. Bladder function is normal. Patient is not sexually active. Contraception method is tubal ligation. Postpartum depression screening: negative    Patient's medications, allergies, past medical, surgical, social and family histories were reviewed and updated as appropriate.    Review of Systems  Pertinent items are noted in HPI.    Objective:     BP 118/70   Ht 1.651 m (5\' 5" )   Wt 75.8 kg (167 lb)   LMP 02/26/2022 (Approximate)   Breastfeeding No   BMI 27.79 kg/m       Constitutional:       Appearance: Normal appearance.   HENT:      Head: Normocephalic and atraumatic.   Cardiovascular:      Rate and Rhythm: Normal rate.   Abdominal:      General: There is no distension. Incision c/d/I     Palpations: fundus firm     Tenderness: no abdominal tenderness.   Neurological:      Mental Status: She is alert.   Psychiatric:         Mood and Affect: Mood normal.         Behavior: Behavior normal.     Assessment:     1wk BP check    Plan:     Contraception: tubal ligation  BP wnl  Continue post op care and lifting precautions  Follow up: 5 weeks

## 2023-07-18 ENCOUNTER — Encounter: Attending: Family
